# Patient Record
Sex: Male | Born: 1990 | Race: White | Hispanic: No | Marital: Single | Smoking: Never smoker
Health system: Southern US, Community
[De-identification: ages and names within clinical notes are randomized; demographics above are authoritative.]

## PROBLEM LIST (undated history)

## (undated) DIAGNOSIS — K219 Gastro-esophageal reflux disease without esophagitis: Secondary | ICD-10-CM

## (undated) DIAGNOSIS — R251 Tremor, unspecified: Secondary | ICD-10-CM

## (undated) DIAGNOSIS — M51369 Other intervertebral disc degeneration, lumbar region without mention of lumbar back pain or lower extremity pain: Secondary | ICD-10-CM

## (undated) DIAGNOSIS — M5136 Other intervertebral disc degeneration, lumbar region: Secondary | ICD-10-CM

## (undated) DIAGNOSIS — M532X9 Spinal instabilities, site unspecified: Secondary | ICD-10-CM

---

## 2013-08-13 ENCOUNTER — Emergency Department (HOSPITAL_COMMUNITY)
Admission: EM | Admit: 2013-08-13 | Discharge: 2013-08-13 | Disposition: A | Discharge status: Home / Self Care | Payer: PRIVATE HEALTH INSURANCE | Attending: Emergency Medicine | Admitting: Emergency Medicine

## 2013-08-13 ENCOUNTER — Encounter (HOSPITAL_COMMUNITY): Payer: Self-pay | Admitting: Emergency Medicine

## 2013-08-13 DIAGNOSIS — S61412A Laceration without foreign body of left hand, initial encounter: Secondary | ICD-10-CM

## 2013-08-13 DIAGNOSIS — W260XXA Contact with knife, initial encounter: Secondary | ICD-10-CM | POA: Insufficient documentation

## 2013-08-13 DIAGNOSIS — Y929 Unspecified place or not applicable: Secondary | ICD-10-CM | POA: Insufficient documentation

## 2013-08-13 DIAGNOSIS — Z23 Encounter for immunization: Secondary | ICD-10-CM | POA: Insufficient documentation

## 2013-08-13 DIAGNOSIS — Y9389 Activity, other specified: Secondary | ICD-10-CM | POA: Insufficient documentation

## 2013-08-13 DIAGNOSIS — S61409A Unspecified open wound of unspecified hand, initial encounter: Secondary | ICD-10-CM | POA: Insufficient documentation

## 2013-08-13 MED ORDER — TETANUS-DIPHTH-ACELL PERTUSSIS 5-2.5-18.5 LF-MCG/0.5 IM SUSP
0.5000 mL | Freq: Once | INTRAMUSCULAR | Status: AC
  Administered 2013-08-13: 0.5 mL via INTRAMUSCULAR
  Filled 2013-08-13: qty 0.5

## 2013-08-13 NOTE — ED Notes (Signed)
PA at bedside suturing pt laceration.

## 2013-08-13 NOTE — ED Provider Notes (Signed)
  Medical screening examination/treatment/procedure(s) were performed by non-physician practitioner and as supervising physician I was immediately available for consultation/collaboration.  EKG Interpretation   None         Emmali Karow, MD 08/13/13 2357 

## 2013-08-13 NOTE — ED Provider Notes (Signed)
CSN: [id number]     Arrival date & time 08/13/13  1532 History  This chart was scribed for non-physician practitioner, Oletha Blend, working with Gerhard Munch, MD by Shari Heritage, ED Scribe. This patient was seen in room TR08C/TR08C and the patient's care was started at 3:42 PM.    Chief Complaint  Patient presents with  . Laceration    The history is provided by the patient. No language interpreter was used.    HPI Comments: Alexander Knight is a 22 y.o. male who presents to the Emergency Department complaining of a laceration in the web space between the left thumb and left index finger that occurred 1-2 hours ago. He states that he was using a knife to cut a lock when his hand slipped and he cut himself. Patient is now reporting moderate pain to the area surrounding the laceration that is worse with movement of his fingers. He reports normal range of motion of all fingers on his left hand. Bleeding was controlled prior to arrival. He denies numbness or tingling in his left hand. He denies any other symptoms or injuries at this time. He has no chronic medical conditions. He does not smoke.  Unknown last tetanus.   History reviewed. No pertinent past medical history. History reviewed. No pertinent past surgical history. No family history on file. History  Substance Use Topics  . Smoking status: Never Smoker   . Smokeless tobacco: Current User    Types: Chew  . Alcohol Use: No    Review of Systems  Skin: Positive for wound.  Neurological: Negative for numbness.  All other systems reviewed and are negative.    Allergies  Codeine  Home Medications  No current outpatient prescriptions on file. Triage Vitals: BP 158/83  Pulse 113  Temp(Src) 97.5 F (36.4 C) (Oral)  Resp 18  SpO2 98%  Physical Exam  Nursing note and vitals reviewed. Constitutional: He is oriented to person, place, and time. He appears well-developed and well-nourished.  HENT:  Head: Normocephalic  and atraumatic.  Mouth/Throat: Oropharynx is clear and moist.  Eyes: Conjunctivae and EOM are normal. Pupils are equal, round, and reactive to light.  Neck: Normal range of motion.  Cardiovascular: Normal rate, regular rhythm and normal heart sounds.   Pulmonary/Chest: Effort normal and breath sounds normal.  Musculoskeletal: Normal range of motion.       Left hand: He exhibits laceration. He exhibits normal range of motion, normal capillary refill, no deformity and no swelling.       Hands: 2 cm laceration to left thumb and index finger webspace, extensor pollicis brevis visible but appears intact, full range of motion of thumb and index finger, no foreign body or signs of infection, strong radial pulse and cap refill, sensation intact  Neurological: He is alert and oriented to person, place, and time.  Skin: Skin is warm and dry.  Psychiatric: He has a normal mood and affect.    ED Course  Procedures (including critical care time)  LACERATION REPAIR Performed by: Garlon Hatchet Authorized by: Garlon Hatchet Consent: Verbal consent obtained. Risks and benefits: risks, benefits and alternatives were discussed Consent given by: patient Patient identity confirmed: provided demographic data Prepped and Draped in normal sterile fashion Wound explored  Laceration Location: left thumb and index finger webspace  Laceration Length: 2cm  No Foreign Bodies seen or palpated  Anesthesia: local infiltration  Local anesthetic: lidocaine 1% without epinephrine  Anesthetic total: 4 ml  Irrigation method: syringe  Amount of cleaning: standard  Skin closure: 6-0 prolene  Number of sutures: 3  Technique: simple interrrupted  Patient tolerance: Patient tolerated the procedure well with no immediate complications.  DIAGNOSTIC STUDIES: Oxygen Saturation is 98% on room air, normal by my interpretation.    COORDINATION OF CARE: 3:54 PM- Patient informed of current plan for treatment  and evaluation and agrees with plan at this time.     MDM   1. Hand laceration, left, initial encounter    Extensor pollicis brevis exposed and appears intact.  Pt had full ROM of thumb and index finger before and after repair.  Tetanus updated.  Instructed to followup with hand surgery, Dr. Merlyn Lot, if problems occur. Keep area clean and dry. Followup with [facility] urgent [facility] in 7 days for suture removal.  Discussed plan with pt, they agreed.  Return precautions advised.  I personally performed the services described in this documentation, which was scribed in my presence. The recorded information has been reviewed and is accurate.  Garlon Hatchet, PA-C 08/13/13 [phone]

## 2013-08-13 NOTE — ED Notes (Signed)
Pt states that he was working with her knife and cut the skin between his thumb and his pointer finger. Pt has an approx. 2cm lac deep to skin, bleeding controlled. Pt washed hands to clean around the laceration and bacitracin applied until sutures.

## 2013-08-13 NOTE — ED Notes (Signed)
The pt was cuting with a knife and his knife slipped.  Lac to the lt thunb and index finger web space.  No active bleeding

## 2017-03-31 HISTORY — PX: BACK SURGERY: SHX140

## 2017-12-23 ENCOUNTER — Other Ambulatory Visit: Payer: Self-pay | Admitting: Orthopedic Surgery

## 2017-12-23 DIAGNOSIS — M545 Low back pain: Principal | ICD-10-CM

## 2017-12-23 DIAGNOSIS — G8929 Other chronic pain: Secondary | ICD-10-CM

## 2018-01-06 ENCOUNTER — Other Ambulatory Visit: Payer: PRIVATE HEALTH INSURANCE

## 2018-01-15 ENCOUNTER — Ambulatory Visit
Admission: RE | Admit: 2018-01-15 | Discharge: 2018-01-15 | Disposition: A | Discharge status: Home / Self Care | Payer: Worker's Compensation | Source: Ambulatory Visit | Attending: Orthopedic Surgery | Admitting: Orthopedic Surgery

## 2018-01-15 ENCOUNTER — Other Ambulatory Visit: Payer: Self-pay | Admitting: Orthopedic Surgery

## 2018-01-15 ENCOUNTER — Ambulatory Visit
Admission: RE | Admit: 2018-01-15 | Discharge: 2018-01-15 | Disposition: A | Discharge status: Home / Self Care | Payer: Self-pay | Source: Ambulatory Visit | Attending: Orthopedic Surgery | Admitting: Orthopedic Surgery

## 2018-01-15 DIAGNOSIS — M545 Low back pain: Principal | ICD-10-CM

## 2018-01-15 DIAGNOSIS — G8929 Other chronic pain: Secondary | ICD-10-CM

## 2018-01-15 MED ORDER — IOPAMIDOL (ISOVUE-M 200) INJECTION 41%
7.5000 mL | Freq: Once | INTRAMUSCULAR | Status: AC
  Administered 2018-01-15: 7.5 mL

## 2018-01-15 MED ORDER — FENTANYL CITRATE (PF) 100 MCG/2ML IJ SOLN
25.0000 ug | INTRAMUSCULAR | Status: DC | PRN
  Administered 2018-01-15: 25 ug via INTRAVENOUS
  Administered 2018-01-15: 50 ug via INTRAVENOUS

## 2018-01-15 MED ORDER — KETOROLAC TROMETHAMINE 30 MG/ML IJ SOLN
30.0000 mg | Freq: Once | INTRAMUSCULAR | Status: AC
  Administered 2018-01-15: 30 mg via INTRAVENOUS

## 2018-01-15 MED ORDER — MIDAZOLAM HCL 2 MG/2ML IJ SOLN: 1.0000 mg | INTRAMUSCULAR | Status: DC | PRN

## 2018-01-15 MED ORDER — SODIUM CHLORIDE 0.9 % IV SOLN
Freq: Once | INTRAVENOUS | Status: AC
  Administered 2018-01-15: 08:00:00 via INTRAVENOUS

## 2018-01-15 MED ORDER — CEFAZOLIN SODIUM-DEXTROSE 2-4 GM/100ML-% IV SOLN
2.0000 g | INTRAVENOUS | Status: AC
  Administered 2018-01-15: 2 g via INTRAVENOUS

## 2018-01-15 NOTE — Progress Notes (Signed)
Pt tolerated procedure well. Pt currently alert and speaking in complete sentences. Pt states pain is "no bad", only his normal pain, not requesting additional pain medication at this time. Will continue to monitor.

## 2018-01-15 NOTE — Discharge Instructions (Signed)
Discogram Post Procedure Discharge Instructions ° °1. May resume a regular diet and any medications that you routinely take (including pain medications). °2. No driving day of procedure. °3. Upon discharge go home and rest for at least 4 hours.  May use an ice pack as needed to injection sites on back.  Ice to back 30 minutes on and 30 minutes off, all day. °4. May remove bandades later, today. °5. It is not unusual to be sore for several days after this procedure. ° ° ° °Please contact our office at [phone] for the following symptoms: ° °· Fever greater than 100 degrees °· Increased swelling, pain, or redness at injection site. ° ° °Thank you for visiting [facility]. ° ° °

## 2018-04-22 HISTORY — PX: COLONOSCOPY: SHX174

## 2018-06-11 ENCOUNTER — Other Ambulatory Visit: Payer: Self-pay | Admitting: Orthopedic Surgery

## 2018-06-16 ENCOUNTER — Other Ambulatory Visit: Payer: Self-pay

## 2018-06-16 ENCOUNTER — Encounter (HOSPITAL_COMMUNITY): Payer: Self-pay | Admitting: *Deleted

## 2018-06-16 NOTE — Anesthesia Preprocedure Evaluation (Addendum)
Anesthesia Evaluation  Patient identified by MRN, date of birth, ID band Patient awake    Reviewed: Allergy & Precautions, NPO status , Patient's Chart, lab work & pertinent test results  Airway Mallampati: II  TM Distance: >3 FB Neck ROM: Full   Comment: Pt w beard Dental no notable dental hx. (+) Teeth Intact, Dental Advisory Given   Pulmonary neg pulmonary ROS,    Pulmonary exam normal breath sounds clear to auscultation       Cardiovascular Exercise Tolerance: Good Normal cardiovascular exam Rhythm:Regular Rate:Normal     Neuro/Psych negative neurological ROS  negative psych ROS   GI/Hepatic GERD  Medicated and Controlled,  Endo/Other    Renal/GU      Musculoskeletal   Abdominal (+) + obese,   Peds  Hematology   Anesthesia Other Findings   Reproductive/Obstetrics                           Anesthesia Physical Anesthesia Plan  ASA: I  Anesthesia Plan: General   Post-op Pain Management:    Induction: Intravenous  PONV Risk Score and Plan: 3 and Treatment may vary due to age or medical condition, Ondansetron and Dexamethasone  Airway Management Planned: Oral ETT  Additional Equipment:   Intra-op Plan:   Post-operative Plan: Extubation in OR  Informed Consent: I have reviewed the patients History and Physical, chart, labs and discussed the procedure including the risks, benefits and alternatives for the proposed anesthesia with the patient or authorized representative who has indicated his/her understanding and acceptance.   Dental advisory given  Plan Discussed with: CRNA, Anesthesiologist and Surgeon  Anesthesia Plan Comments:        Anesthesia Quick Evaluation

## 2018-06-16 NOTE — Progress Notes (Signed)
I left a voice mail  message for  Alexander Knight to correct the unapproved abbreviation- S1 on the consent.

## 2018-06-17 ENCOUNTER — Inpatient Hospital Stay (HOSPITAL_COMMUNITY)
Admission: RE | Admit: 2018-06-17 | Discharge: 2018-06-18 | DRG: 460 | Disposition: A | Discharge status: Home / Self Care | Payer: PRIVATE HEALTH INSURANCE | Source: Ambulatory Visit | Attending: Orthopedic Surgery | Admitting: Orthopedic Surgery

## 2018-06-17 ENCOUNTER — Inpatient Hospital Stay (HOSPITAL_COMMUNITY): Payer: PRIVATE HEALTH INSURANCE

## 2018-06-17 ENCOUNTER — Inpatient Hospital Stay (HOSPITAL_COMMUNITY): Payer: PRIVATE HEALTH INSURANCE | Admitting: Anesthesiology

## 2018-06-17 ENCOUNTER — Encounter (HOSPITAL_COMMUNITY): Payer: Self-pay | Admitting: Certified Registered Nurse Anesthetist

## 2018-06-17 ENCOUNTER — Encounter (HOSPITAL_COMMUNITY)
Admission: RE | Disposition: A | Discharge status: Home / Self Care | Payer: Self-pay | Source: Ambulatory Visit | Attending: Orthopedic Surgery

## 2018-06-17 DIAGNOSIS — Z885 Allergy status to narcotic agent status: Secondary | ICD-10-CM

## 2018-06-17 DIAGNOSIS — Z720 Tobacco use: Secondary | ICD-10-CM | POA: Diagnosis not present

## 2018-06-17 DIAGNOSIS — M5117 Intervertebral disc disorders with radiculopathy, lumbosacral region: Secondary | ICD-10-CM | POA: Diagnosis present

## 2018-06-17 DIAGNOSIS — K219 Gastro-esophageal reflux disease without esophagitis: Secondary | ICD-10-CM | POA: Diagnosis present

## 2018-06-17 DIAGNOSIS — M5137 Other intervertebral disc degeneration, lumbosacral region: Secondary | ICD-10-CM | POA: Diagnosis present

## 2018-06-17 DIAGNOSIS — Z419 Encounter for procedure for purposes other than remedying health state, unspecified: Secondary | ICD-10-CM

## 2018-06-17 HISTORY — DX: Other intervertebral disc degeneration, lumbar region: M51.36

## 2018-06-17 HISTORY — DX: Other intervertebral disc degeneration, lumbar region without mention of lumbar back pain or lower extremity pain: M51.369

## 2018-06-17 HISTORY — DX: Spinal instabilities, site unspecified: M53.2X9

## 2018-06-17 HISTORY — DX: Tremor, unspecified: R25.1

## 2018-06-17 HISTORY — PX: TRANSFORAMINAL LUMBAR INTERBODY FUSION (TLIF) WITH PEDICLE SCREW FIXATION 2 LEVEL: SHX6142

## 2018-06-17 HISTORY — DX: Gastro-esophageal reflux disease without esophagitis: K21.9

## 2018-06-17 LAB — URINALYSIS, ROUTINE W REFLEX MICROSCOPIC
Bacteria, UA: NONE SEEN
Bilirubin Urine: NEGATIVE
GLUCOSE, UA: NEGATIVE mg/dL
Ketones, ur: NEGATIVE mg/dL
Leukocytes, UA: NEGATIVE
NITRITE: NEGATIVE
PH: 5 (ref 5.0–8.0)
Protein, ur: NEGATIVE mg/dL
SPECIFIC GRAVITY, URINE: 1.028 (ref 1.005–1.030)

## 2018-06-17 LAB — COMPREHENSIVE METABOLIC PANEL
ALBUMIN: 4.3 g/dL (ref 3.5–5.0)
ALK PHOS: 79 U/L (ref 38–126)
ALT: 40 U/L (ref 0–44)
ANION GAP: 12 (ref 5–15)
AST: 26 U/L (ref 15–41)
BILIRUBIN TOTAL: 1.6 mg/dL — AB (ref 0.3–1.2)
BUN: 11 mg/dL (ref 6–20)
CALCIUM: 9.2 mg/dL (ref 8.9–10.3)
CO2: 22 mmol/L (ref 22–32)
Chloride: 105 mmol/L (ref 98–111)
Creatinine, Ser: 0.75 mg/dL (ref 0.61–1.24)
GFR calc non Af Amer: >60 mL/min (ref >60)
GLUCOSE: 108 mg/dL — AB (ref 70–99)
POTASSIUM: 3.7 mmol/L (ref 3.5–5.1)
SODIUM: 139 mmol/L (ref 135–145)
TOTAL PROTEIN: 7.6 g/dL (ref 6.5–8.1)

## 2018-06-17 LAB — PROTIME-INR
INR: 1
Prothrombin Time: 13.1 seconds (ref 11.4–15.2)

## 2018-06-17 LAB — CBC WITH DIFFERENTIAL/PLATELET
Abs Immature Granulocytes: 0.2 10*3/uL — ABNORMAL HIGH (ref 0.0–0.1)
BASOS ABS: 0.1 10*3/uL (ref 0.0–0.1)
Basophils Relative: 1 %
EOS ABS: 0.1 10*3/uL (ref 0.0–0.7)
EOS PCT: 1 %
HEMATOCRIT: 48.3 % (ref 39.0–52.0)
HEMOGLOBIN: 15.9 g/dL (ref 13.0–17.0)
Immature Granulocytes: 2 %
LYMPHS PCT: 27 %
Lymphs Abs: 3.8 10*3/uL (ref 0.7–4.0)
MCH: 29.1 pg (ref 26.0–34.0)
MCHC: 32.9 g/dL (ref 30.0–36.0)
MCV: 88.3 fL (ref 78.0–100.0)
MONO ABS: 1.2 10*3/uL — AB (ref 0.1–1.0)
Monocytes Relative: 9 %
Neutro Abs: 8.6 10*3/uL — ABNORMAL HIGH (ref 1.7–7.7)
Neutrophils Relative %: 60 %
Platelets: 370 10*3/uL (ref 150–400)
RBC: 5.47 MIL/uL (ref 4.22–5.81)
RDW: 11.8 % (ref 11.5–15.5)
WBC: 14 10*3/uL — AB (ref 4.0–10.5)

## 2018-06-17 LAB — APTT: APTT: 30 s (ref 24–36)

## 2018-06-17 SURGERY — TRANSFORAMINAL LUMBAR INTERBODY FUSION (TLIF) WITH PEDICLE SCREW FIXATION 2 LEVEL
Anesthesia: General | Site: Back | Laterality: Right

## 2018-06-17 MED ORDER — DIAZEPAM 5 MG PO TABS
5.0000 mg | ORAL_TABLET | Freq: Four times a day (QID) | ORAL | Status: DC | PRN
  Administered 2018-06-17 – 2018-06-18 (×3): 5 mg via ORAL
  Filled 2018-06-17 (×3): qty 1

## 2018-06-17 MED ORDER — METHYLENE BLUE 0.5 % INJ SOLN
INTRAVENOUS | Status: AC
  Filled 2018-06-17: qty 10

## 2018-06-17 MED ORDER — METHYLENE BLUE 0.5 % INJ SOLN
INTRAVENOUS | Status: DC | PRN
  Administered 2018-06-17: .5 mL via INTRADERMAL

## 2018-06-17 MED ORDER — ACETAMINOPHEN 325 MG PO TABS: 650.0000 mg | ORAL_TABLET | ORAL | Status: DC | PRN

## 2018-06-17 MED ORDER — CLONIDINE HCL 0.2 MG PO TABS
ORAL_TABLET | ORAL | Status: AC
  Administered 2018-06-17: 0.2 mg via ORAL
  Filled 2018-06-17: qty 1

## 2018-06-17 MED ORDER — MIDAZOLAM HCL 5 MG/5ML IJ SOLN
INTRAMUSCULAR | Status: DC | PRN
  Administered 2018-06-17: 2 mg via INTRAVENOUS

## 2018-06-17 MED ORDER — ALUM & MAG HYDROXIDE-SIMETH 200-200-20 MG/5ML PO SUSP: 30.0000 mL | Freq: Four times a day (QID) | ORAL | Status: DC | PRN

## 2018-06-17 MED ORDER — ONDANSETRON HCL 4 MG PO TABS: 4.0000 mg | ORAL_TABLET | Freq: Four times a day (QID) | ORAL | Status: DC | PRN

## 2018-06-17 MED ORDER — ZOLPIDEM TARTRATE 5 MG PO TABS: 5.0000 mg | ORAL_TABLET | Freq: Every evening | ORAL | Status: DC | PRN

## 2018-06-17 MED ORDER — HYDROCODONE-ACETAMINOPHEN 7.5-325 MG PO TABS
ORAL_TABLET | ORAL | Status: AC
  Filled 2018-06-17: qty 1

## 2018-06-17 MED ORDER — ONDANSETRON HCL 4 MG/2ML IJ SOLN
INTRAMUSCULAR | Status: AC
  Filled 2018-06-17: qty 2

## 2018-06-17 MED ORDER — ONDANSETRON HCL 4 MG/2ML IJ SOLN
INTRAMUSCULAR | Status: DC | PRN
  Administered 2018-06-17: 4 mg via INTRAVENOUS

## 2018-06-17 MED ORDER — SODIUM CHLORIDE 0.9 % IV SOLN
INTRAVENOUS | Status: DC | PRN
  Administered 2018-06-17: 14:00:00 via INTRAVENOUS
  Administered 2018-06-17: 20 ug/min via INTRAVENOUS

## 2018-06-17 MED ORDER — SODIUM CHLORIDE 0.9 % IV SOLN: 250.0000 mL | INTRAVENOUS | Status: DC

## 2018-06-17 MED ORDER — THROMBIN (RECOMBINANT) 20000 UNITS EX SOLR
CUTANEOUS | Status: AC
  Filled 2018-06-17: qty 20000

## 2018-06-17 MED ORDER — PHENYLEPHRINE 40 MCG/ML (10ML) SYRINGE FOR IV PUSH (FOR BLOOD PRESSURE SUPPORT)
PREFILLED_SYRINGE | INTRAVENOUS | Status: AC
  Filled 2018-06-17: qty 20

## 2018-06-17 MED ORDER — SODIUM CHLORIDE 0.9% FLUSH: 3.0000 mL | INTRAVENOUS | Status: DC | PRN

## 2018-06-17 MED ORDER — OXYCODONE-ACETAMINOPHEN 5-325 MG PO TABS
1.0000 | ORAL_TABLET | ORAL | Status: DC | PRN
  Administered 2018-06-17 – 2018-06-18 (×5): 2 via ORAL
  Filled 2018-06-17 (×5): qty 2

## 2018-06-17 MED ORDER — PROPOFOL 10 MG/ML IV BOLUS
INTRAVENOUS | Status: DC | PRN
  Administered 2018-06-17: 200 mg via INTRAVENOUS

## 2018-06-17 MED ORDER — MEPERIDINE HCL 50 MG/ML IJ SOLN: 6.2500 mg | INTRAMUSCULAR | Status: DC | PRN

## 2018-06-17 MED ORDER — HYDROCODONE-ACETAMINOPHEN 7.5-325 MG PO TABS
1.0000 | ORAL_TABLET | Freq: Once | ORAL | Status: AC | PRN
  Administered 2018-06-17: 1 via ORAL

## 2018-06-17 MED ORDER — FENTANYL CITRATE (PF) 250 MCG/5ML IJ SOLN
INTRAMUSCULAR | Status: AC
  Filled 2018-06-17: qty 5

## 2018-06-17 MED ORDER — BUPIVACAINE-EPINEPHRINE 0.25% -1:200000 IJ SOLN
INTRAMUSCULAR | Status: DC | PRN
  Administered 2018-06-17: 30 mL

## 2018-06-17 MED ORDER — PHENOL 1.4 % MT LIQD: 1.0000 | OROMUCOSAL | Status: DC | PRN

## 2018-06-17 MED ORDER — ONDANSETRON HCL 4 MG/2ML IJ SOLN: 4.0000 mg | Freq: Four times a day (QID) | INTRAMUSCULAR | Status: DC | PRN

## 2018-06-17 MED ORDER — PROMETHAZINE HCL 25 MG/ML IJ SOLN: 6.2500 mg | INTRAMUSCULAR | Status: DC | PRN

## 2018-06-17 MED ORDER — BUPIVACAINE-EPINEPHRINE (PF) 0.25% -1:200000 IJ SOLN
INTRAMUSCULAR | Status: AC
  Filled 2018-06-17: qty 30

## 2018-06-17 MED ORDER — THROMBIN 20000 UNITS EX SOLR
CUTANEOUS | Status: DC | PRN
  Administered 2018-06-17: 20000 [IU] via TOPICAL

## 2018-06-17 MED ORDER — FENTANYL CITRATE (PF) 100 MCG/2ML IJ SOLN
INTRAMUSCULAR | Status: DC | PRN
  Administered 2018-06-17: 50 ug via INTRAVENOUS
  Administered 2018-06-17: 200 ug via INTRAVENOUS
  Administered 2018-06-17 (×3): 50 ug via INTRAVENOUS

## 2018-06-17 MED ORDER — OXYCODONE HCL 5 MG PO TABS: 5.0000 mg | ORAL_TABLET | ORAL | Status: DC | PRN

## 2018-06-17 MED ORDER — ALBUMIN HUMAN 5 % IV SOLN
INTRAVENOUS | Status: DC | PRN
  Administered 2018-06-17: 10:00:00 via INTRAVENOUS

## 2018-06-17 MED ORDER — SUCCINYLCHOLINE CHLORIDE 200 MG/10ML IV SOSY
PREFILLED_SYRINGE | INTRAVENOUS | Status: AC
  Filled 2018-06-17: qty 10

## 2018-06-17 MED ORDER — SODIUM CHLORIDE 0.9 % IV SOLN
250.0000 mL | INTRAVENOUS | Status: DC
  Administered 2018-06-17: 250 mL via INTRAVENOUS

## 2018-06-17 MED ORDER — PHENYLEPHRINE 40 MCG/ML (10ML) SYRINGE FOR IV PUSH (FOR BLOOD PRESSURE SUPPORT)
PREFILLED_SYRINGE | INTRAVENOUS | Status: DC | PRN
  Administered 2018-06-17: 160 ug via INTRAVENOUS

## 2018-06-17 MED ORDER — GABAPENTIN 300 MG PO CAPS
300.0000 mg | ORAL_CAPSULE | Freq: Once | ORAL | Status: AC
  Administered 2018-06-17: 300 mg via ORAL

## 2018-06-17 MED ORDER — ACETAMINOPHEN 500 MG PO TABS
1000.0000 mg | ORAL_TABLET | Freq: Once | ORAL | Status: AC
  Administered 2018-06-17: 1000 mg via ORAL

## 2018-06-17 MED ORDER — MIDAZOLAM HCL 2 MG/2ML IJ SOLN
INTRAMUSCULAR | Status: AC
  Filled 2018-06-17: qty 2

## 2018-06-17 MED ORDER — POVIDONE-IODINE 7.5 % EX SOLN
1.0000 "application " | Freq: Once | CUTANEOUS | Status: DC
  Filled 2018-06-17: qty 118

## 2018-06-17 MED ORDER — BUPIVACAINE LIPOSOME 1.3 % IJ SUSP
20.0000 mL | INTRAMUSCULAR | Status: AC
  Administered 2018-06-17: 20 mL
  Filled 2018-06-17: qty 20

## 2018-06-17 MED ORDER — MORPHINE SULFATE (PF) 2 MG/ML IV SOLN
1.0000 mg | INTRAVENOUS | Status: DC | PRN
  Administered 2018-06-17: 1 mg via INTRAVENOUS
  Filled 2018-06-17: qty 1

## 2018-06-17 MED ORDER — MENTHOL 3 MG MT LOZG: 1.0000 | LOZENGE | OROMUCOSAL | Status: DC | PRN

## 2018-06-17 MED ORDER — HYDROMORPHONE HCL 1 MG/ML IJ SOLN
INTRAMUSCULAR | Status: AC
  Administered 2018-06-17: 0.5 mg via INTRAVENOUS
  Filled 2018-06-17: qty 1

## 2018-06-17 MED ORDER — HYDROMORPHONE HCL 1 MG/ML IJ SOLN
0.2500 mg | INTRAMUSCULAR | Status: DC | PRN
  Administered 2018-06-17 (×2): 0.5 mg via INTRAVENOUS

## 2018-06-17 MED ORDER — 0.9 % SODIUM CHLORIDE (POUR BTL) OPTIME
TOPICAL | Status: DC | PRN
  Administered 2018-06-17 (×3): 1000 mL

## 2018-06-17 MED ORDER — GABAPENTIN 300 MG PO CAPS
ORAL_CAPSULE | ORAL | Status: AC
  Administered 2018-06-17: 300 mg via ORAL
  Filled 2018-06-17: qty 1

## 2018-06-17 MED ORDER — EPHEDRINE 5 MG/ML INJ
INTRAVENOUS | Status: AC
  Filled 2018-06-17: qty 10

## 2018-06-17 MED ORDER — LACTATED RINGERS IV SOLN
INTRAVENOUS | Status: DC | PRN
  Administered 2018-06-17 (×3): via INTRAVENOUS

## 2018-06-17 MED ORDER — CLONIDINE HCL 0.2 MG PO TABS
0.2000 mg | ORAL_TABLET | Freq: Once | ORAL | Status: AC
  Administered 2018-06-17: 0.2 mg via ORAL

## 2018-06-17 MED ORDER — SODIUM CHLORIDE 0.9% FLUSH
3.0000 mL | Freq: Two times a day (BID) | INTRAVENOUS | Status: DC
  Administered 2018-06-17: 3 mL via INTRAVENOUS

## 2018-06-17 MED ORDER — CEFAZOLIN SODIUM-DEXTROSE 1-4 GM/50ML-% IV SOLN
1.0000 g | Freq: Three times a day (TID) | INTRAVENOUS | Status: AC
  Administered 2018-06-17 – 2018-06-18 (×2): 1 g via INTRAVENOUS
  Filled 2018-06-17 (×2): qty 50

## 2018-06-17 MED ORDER — ACETAMINOPHEN 10 MG/ML IV SOLN: 1000.0000 mg | Freq: Once | INTRAVENOUS | Status: DC | PRN

## 2018-06-17 MED ORDER — ACETAMINOPHEN 500 MG PO TABS
ORAL_TABLET | ORAL | Status: AC
  Administered 2018-06-17: 1000 mg via ORAL
  Filled 2018-06-17: qty 2

## 2018-06-17 MED ORDER — LIDOCAINE HCL (CARDIAC) PF 100 MG/5ML IV SOSY
PREFILLED_SYRINGE | INTRAVENOUS | Status: DC | PRN
  Administered 2018-06-17: 100 mg via INTRAVENOUS

## 2018-06-17 MED ORDER — PROPOFOL 10 MG/ML IV BOLUS
INTRAVENOUS | Status: AC
  Filled 2018-06-17: qty 20

## 2018-06-17 MED ORDER — POTASSIUM CHLORIDE IN NACL 20-0.9 MEQ/L-% IV SOLN: INTRAVENOUS | Status: DC

## 2018-06-17 MED ORDER — ROCURONIUM BROMIDE 10 MG/ML (PF) SYRINGE
PREFILLED_SYRINGE | INTRAVENOUS | Status: DC | PRN
  Administered 2018-06-17: 10 mg via INTRAVENOUS
  Administered 2018-06-17: 20 mg via INTRAVENOUS
  Administered 2018-06-17: 50 mg via INTRAVENOUS
  Administered 2018-06-17: 20 mg via INTRAVENOUS

## 2018-06-17 MED ORDER — DEXAMETHASONE SODIUM PHOSPHATE 10 MG/ML IJ SOLN
INTRAMUSCULAR | Status: DC | PRN
  Administered 2018-06-17: 10 mg via INTRAVENOUS

## 2018-06-17 MED ORDER — CEFAZOLIN SODIUM-DEXTROSE 2-4 GM/100ML-% IV SOLN
2.0000 g | INTRAVENOUS | Status: AC
  Administered 2018-06-17 (×2): 2 g via INTRAVENOUS

## 2018-06-17 MED ORDER — CEFAZOLIN SODIUM-DEXTROSE 2-4 GM/100ML-% IV SOLN
INTRAVENOUS | Status: AC
  Filled 2018-06-17: qty 100

## 2018-06-17 MED ORDER — ACETAMINOPHEN 650 MG RE SUPP: 650.0000 mg | RECTAL | Status: DC | PRN

## 2018-06-17 SURGICAL SUPPLY — 92 items
BENZOIN TINCTURE PRP APPL 2/3 (GAUZE/BANDAGES/DRESSINGS) ×3 IMPLANT
BLADE CLIPPER SURG (BLADE) IMPLANT
BONE VIVIGEN FORMABLE 5.4CC (Bone Implant) ×6 IMPLANT
BUR PRESCISION 1.7 ELITE (BURR) ×3 IMPLANT
BUR ROUND FLUTED 5 RND (BURR) ×2 IMPLANT
BUR ROUND FLUTED 5MM RND (BURR) ×1
BUR ROUND PRECISION 4.0 (BURR) IMPLANT
BUR ROUND PRECISION 4.0MM (BURR)
BUR SABER RD CUTTING 3.0 (BURR) IMPLANT
BUR SABER RD CUTTING 3.0MM (BURR)
CAGE BULLET CONCORDE 9X10X27 (Cage) ×2 IMPLANT
CAGE BULLET CONCORDE 9X10X27MM (Cage) ×1 IMPLANT
CAGE CONCORDE BULLET 9X11X27 (Cage) ×1 IMPLANT
CAGE CONCORDE BULLET 9X11X27MM (Cage) ×1 IMPLANT
CAGE SPNL 5D BLT NOSE 27X9X11 (Cage) ×1 IMPLANT
CARTRIDGE OIL MAESTRO DRILL (MISCELLANEOUS) ×1 IMPLANT
CLOSURE WOUND 1/2 X4 (GAUZE/BANDAGES/DRESSINGS) ×2
CONT SPEC 4OZ CLIKSEAL STRL BL (MISCELLANEOUS) ×3 IMPLANT
COVER MAYO STAND STRL (DRAPES) ×6 IMPLANT
COVER SURGICAL LIGHT HANDLE (MISCELLANEOUS) ×3 IMPLANT
DIFFUSER DRILL AIR PNEUMATIC (MISCELLANEOUS) ×3 IMPLANT
DRAIN CHANNEL 15F RND FF W/TCR (WOUND CARE) IMPLANT
DRAPE C-ARM 42X72 X-RAY (DRAPES) ×3 IMPLANT
DRAPE C-ARMOR (DRAPES) ×3 IMPLANT
DRAPE POUCH INSTRU U-SHP 10X18 (DRAPES) ×3 IMPLANT
DRAPE SURG 17X23 STRL (DRAPES) ×12 IMPLANT
DURAPREP 26ML APPLICATOR (WOUND CARE) ×3 IMPLANT
ELECT BLADE 4.0 EZ CLEAN MEGAD (MISCELLANEOUS) ×3
ELECT CAUTERY BLADE 6.4 (BLADE) ×3 IMPLANT
ELECT REM PT RETURN 9FT ADLT (ELECTROSURGICAL) ×3
ELECTRODE BLDE 4.0 EZ CLN MEGD (MISCELLANEOUS) ×1 IMPLANT
ELECTRODE REM PT RTRN 9FT ADLT (ELECTROSURGICAL) ×1 IMPLANT
EVACUATOR SILICONE 100CC (DRAIN) IMPLANT
FEE INTRAOP MONITOR IMPULS NCS (MISCELLANEOUS) ×1 IMPLANT
FILTER STRAW FLUID ASPIR (MISCELLANEOUS) ×3 IMPLANT
GAUZE 4X4 16PLY RFD (DISPOSABLE) ×3 IMPLANT
GAUZE SPONGE 4X4 12PLY STRL (GAUZE/BANDAGES/DRESSINGS) ×3 IMPLANT
GLOVE BIO SURGEON STRL SZ7 (GLOVE) ×3 IMPLANT
GLOVE BIO SURGEON STRL SZ8 (GLOVE) ×3 IMPLANT
GLOVE BIOGEL PI IND STRL 7.0 (GLOVE) ×1 IMPLANT
GLOVE BIOGEL PI IND STRL 8 (GLOVE) ×1 IMPLANT
GLOVE BIOGEL PI INDICATOR 7.0 (GLOVE) ×2
GLOVE BIOGEL PI INDICATOR 8 (GLOVE) ×2
GOWN STRL REUS W/ TWL LRG LVL3 (GOWN DISPOSABLE) ×2 IMPLANT
GOWN STRL REUS W/ TWL XL LVL3 (GOWN DISPOSABLE) ×1 IMPLANT
GOWN STRL REUS W/TWL LRG LVL3 (GOWN DISPOSABLE) ×4
GOWN STRL REUS W/TWL XL LVL3 (GOWN DISPOSABLE) ×2
INTRAOP MONITOR FEE IMPULS NCS (MISCELLANEOUS) ×1
INTRAOP MONITOR FEE IMPULSE (MISCELLANEOUS) ×2
IV CATH 14GX2 1/4 (CATHETERS) ×3 IMPLANT
KIT BASIN OR (CUSTOM PROCEDURE TRAY) ×3 IMPLANT
KIT POSITION SURG JACKSON T1 (MISCELLANEOUS) ×3 IMPLANT
KIT TURNOVER KIT B (KITS) ×3 IMPLANT
MARKER SKIN DUAL TIP RULER LAB (MISCELLANEOUS) ×6 IMPLANT
NDL SAFETY ECLIPSE 18X1.5 (NEEDLE) ×1 IMPLANT
NEEDLE 22X1 1/2 (OR ONLY) (NEEDLE) ×6 IMPLANT
NEEDLE HYPO 18GX1.5 SHARP (NEEDLE) ×2
NEEDLE HYPO 25GX1X1/2 BEV (NEEDLE) ×3 IMPLANT
NEEDLE SPNL 18GX3.5 QUINCKE PK (NEEDLE) ×6 IMPLANT
NS IRRIG 1000ML POUR BTL (IV SOLUTION) ×3 IMPLANT
OIL CARTRIDGE MAESTRO DRILL (MISCELLANEOUS) ×3
PACK LAMINECTOMY ORTHO (CUSTOM PROCEDURE TRAY) ×3 IMPLANT
PACK UNIVERSAL I (CUSTOM PROCEDURE TRAY) ×3 IMPLANT
PAD ARMBOARD 7.5X6 YLW CONV (MISCELLANEOUS) ×6 IMPLANT
PATTIES SURGICAL .5 X1 (DISPOSABLE) ×3 IMPLANT
PATTIES SURGICAL .5X1.5 (GAUZE/BANDAGES/DRESSINGS) ×3 IMPLANT
PROBE PEDCLE PROBE MAGSTM DISP (MISCELLANEOUS) ×3 IMPLANT
PUTTY DBX 1CC (Putty) ×3 IMPLANT
PUTTY DBX 1CC DEPUY (Putty) ×1 IMPLANT
ROD EXPEDIUM PER BENT 65MM (Rod) ×6 IMPLANT
SCREW CORTICAL VIPER 7X40MM (Screw) ×6 IMPLANT
SCREW SET SINGLE INNER (Screw) ×18 IMPLANT
SCREW VIPER CORTICAL FIX 6X40 (Screw) ×12 IMPLANT
SPONGE INTESTINAL PEANUT (DISPOSABLE) ×3 IMPLANT
SPONGE SURGIFOAM ABS GEL 100 (HEMOSTASIS) ×3 IMPLANT
STAPLER VISISTAT 35W (STAPLE) ×3 IMPLANT
STRIP CLOSURE SKIN 1/2X4 (GAUZE/BANDAGES/DRESSINGS) ×4 IMPLANT
SURGIFLO W/THROMBIN 8M KIT (HEMOSTASIS) IMPLANT
SUT MNCRL AB 4-0 PS2 18 (SUTURE) ×3 IMPLANT
SUT VIC AB 0 CT1 18XCR BRD 8 (SUTURE) ×1 IMPLANT
SUT VIC AB 0 CT1 8-18 (SUTURE) ×2
SUT VIC AB 1 CT1 18XCR BRD 8 (SUTURE) ×2 IMPLANT
SUT VIC AB 1 CT1 8-18 (SUTURE) ×4
SUT VIC AB 2-0 CT2 18 VCP726D (SUTURE) ×6 IMPLANT
SYR 20CC LL (SYRINGE) ×6 IMPLANT
SYR BULB IRRIGATION 50ML (SYRINGE) ×3 IMPLANT
SYR CONTROL 10ML LL (SYRINGE) ×6 IMPLANT
SYR TB 1ML LUER SLIP (SYRINGE) ×3 IMPLANT
TAPE CLOTH SURG 4X10 WHT LF (GAUZE/BANDAGES/DRESSINGS) ×3 IMPLANT
TRAY FOLEY MTR SLVR 16FR STAT (SET/KITS/TRAYS/PACK) ×3 IMPLANT
WATER STERILE IRR 1000ML POUR (IV SOLUTION) ×3 IMPLANT
YANKAUER SUCT BULB TIP NO VENT (SUCTIONS) ×3 IMPLANT

## 2018-06-17 NOTE — H&P (Signed)
PREOPERATIVE H&P  Chief Complaint: Low back pain  HPI: Alexander Knight is a 27 y.o. male who presents with ongoing pain in the low back and bilateral legs s/p a work injury dated 09/05/2017.   MRI reveals DDD at L4/5 and L5/S1. + discogram at these levels with a negative control level.   Patient has failed multiple forms of conservative care and continues to have pain (see office notes for additional details regarding the patient's full course of treatment)  Past Medical History:  Diagnosis Date  . DDD (degenerative disc disease), lumbar   . GERD (gastroesophageal reflux disease)    tums prn  . Spine instability   . Tremor    attention tremor   Past Surgical History:  Procedure Laterality Date  . BACK SURGERY Left 03/31/2017   Hemilaminestomy/ DIscectomy L4-L5, L5-S1  . COLONOSCOPY  04/22/2018   Social History   Socioeconomic History  . Marital status: Single    Spouse name: Not on file  . Number of children: Not on file  . Years of education: Not on file  . Highest education level: Not on file  Occupational History  . Not on file  Social Needs  . Financial resource strain: Not on file  . Food insecurity:    Worry: Not on file    Inability: Not on file  . Transportation needs:    Medical: Not on file    Non-medical: Not on file  Tobacco Use  . Smoking status: Never Smoker  . Smokeless tobacco: Current User    Types: Chew  . Tobacco comment:  1 can per day  Substance and Sexual Activity  . Alcohol use: No  . Drug use: Not on file  . Sexual activity: Not on file  Lifestyle  . Physical activity:    Days per week: Not on file    Minutes per session: Not on file  . Stress: Not on file  Relationships  . Social connections:    Talks on phone: Not on file    Gets together: Not on file    Attends religious service: Not on file    Active member of club or organization: Not on file    Attends meetings of clubs or organizations: Not on file    Relationship  status: Not on file  Other Topics Concern  . Not on file  Social History Narrative  . Not on file   Family History  Problem Relation Age of Onset  . Heart disease Maternal Grandmother   . High blood pressure Paternal Grandmother    Allergies  Allergen Reactions  . Codeine Nausea And Vomiting   Prior to Admission medications   Medication Sig Start Date End Date Taking? Authorizing Provider  acetaminophen-codeine (TYLENOL #3) 300-30 MG tablet Take 1-2 tablets by mouth every 6 (six) hours as needed for pain. 05/13/18  Yes [provider]  cyclobenzaprine (FLEXERIL) 5 MG tablet Take 5 mg by mouth at bedtime as needed for muscle spasms. 05/13/18  Yes [provider]     All other systems have been reviewed and were otherwise negative with the exception of those mentioned in the HPI and as above.  Physical Exam: Vitals:   06/17/18 0634  BP: (!) 149/95  Pulse: (!) 109  Resp: 20  Temp: 98.1 F (36.7 C)  SpO2: 97%    Body mass index is 30.96 kg/m.  General: Alert, no acute distress Cardiovascular: No pedal edema Respiratory: No cyanosis, no use of accessory  musculature Skin: No lesions in the area of chief complaint Neurologic: Sensation intact distally Psychiatric: Patient is competent for consent with normal mood and affect Lymphatic: No axillary or cervical lymphadenopathy  Assessment/Plan: AXIAL LOW BACK PAIN / DEGENERATIVE DISC DISEASE AT L4-L5 AND L5-S1 Plan for Procedure(s): RIGHT-SIDED LUMBAR 4-5, LUMBAR 5 - SACRUM 1 TRANSFORAMINAL LUMBAR INTERBODY FUSION WITH INSTRUMENTATION AND ALLOGRAFT (6 hours requested)   Emilee HeroUMONSKI,Amand Lemoine LEONARD, MD 06/17/2018 7:46 AM

## 2018-06-17 NOTE — Transfer of Care (Signed)
**Note Alexander-Identified via Obfuscation** Immediate Anesthesia Transfer of Care Note  Patient: Alexander Knight  Procedure(s) Performed: RIGHT-SIDED LUMBAR 4-5, LUMBAR 5 - SACRUM 1 TRANSFORAMINAL LUMBAR INTERBODY FUSION WITH INSTRUMENTATION AND ALLOGRAFT (6 hours requested) (Right Back)  Patient Location: PACU  Anesthesia Type:General  Level of Consciousness: drowsy  Airway & Oxygen Therapy: Patient Spontanous Breathing and Patient connected to nasal cannula oxygen  Post-op Assessment: Report given to RN and Post -op Vital signs reviewed and stable  Post vital signs: Reviewed and stable  Last Vitals:  Vitals Value Taken Time  BP 103/58 06/17/2018  3:10 PM  Temp    Pulse 114 06/17/2018  3:10 PM  Resp 18 06/17/2018  3:10 PM  SpO2 100 % 06/17/2018  3:10 PM  Vitals shown include unvalidated device data.  Last Pain:  Vitals:   06/17/18 0652  TempSrc:   PainSc: 4       Patients Stated Pain Goal: 2 (06/17/18 [id number])  Complications: No apparent anesthesia complications

## 2018-06-17 NOTE — Anesthesia Procedure Notes (Signed)
Procedure Name: Intubation Date/Time: 06/17/2018 8:35 AM Performed by: Carney Living, CRNA Pre-anesthesia Checklist: Patient identified, Emergency Drugs available, Suction available, Patient being monitored and Timeout performed Patient Re-evaluated:Patient Re-evaluated prior to induction Oxygen Delivery Method: Circle system utilized Preoxygenation: Pre-oxygenation with 100% oxygen Induction Type: IV induction Ventilation: Mask ventilation without difficulty and Oral airway inserted - appropriate to patient size Laryngoscope Size: Mac and 4 Grade View: Grade II Tube type: Oral Tube size: 7.5 mm Number of attempts: 1 Airway Equipment and Method: Stylet Placement Confirmation: ETT inserted through vocal cords under direct vision,  positive ETCO2 and breath sounds checked- equal and bilateral Secured at: 22 cm Tube secured with: Tape Dental Injury: Teeth and Oropharynx as per pre-operative assessment

## 2018-06-17 NOTE — Op Note (Signed)
MEDICAL RECORD NO: [id number]   PHYSICIAN:  Estill Bamberg, MD      DATE OF BIRTH:  1991/03/24   DATE OF PROCEDURE:  06/17/2018                                                            OPERATIVE REPORT     PREOPERATIVE DIAGNOSES: 1. Bilateral lumbar radiculopathy. 2. Degenerative disc disease, L4/5, L5/S1 3. S/p previous L4/5 and L5/S1 decompression   POSTOPERATIVE DIAGNOSES: 1. Bilateral lumbar radiculopathy. 2. Degenerative disc disease, L4/5, L5/S1 3. S/p previous L4/5 and L5/S1 decompression   PROCEDURES: 1. Right-sided L4-5, L5/S1 transforaminal lumbar interbody fusion. 2. Left-sided L4-5 posterolateral fusion. 3. Insertion of interbody device x2 (Concorde intervertebral spacers). 4. Placement of posterior instrumentation L4, L5, S1 bilaterally. 5. Use of local autograft. 6. Use of morselized allograft - Vivigen and DBX-putty 7. Intraoperative use of fluoroscopy.   SURGEON:  Estill Bamberg, MD.   ASSISTANT:  Alexander Florence, PA-C.   ANESTHESIA:  General endotracheal anesthesia.   COMPLICATIONS:  None.   DISPOSITION:  Stable.   ESTIMATED BLOOD LOSS:  250cc   INDICATIONS FOR SURGERY:  Briefly, Alexander Knight is a pleasant 27 year old male who did present to me with severe and ongoing pain in his low back and bilateral legs.    His symptoms have been present for well over a year.  I did feel that the symptoms were secondary to the findings noted above. He did fail extensive conservative care and did wish to proceed with the procedure noted above.   OPERATIVE DETAILS:  On  06/17/2018, the patient was brought to surgery and general endotracheal anesthesia was administered.  The patient was placed prone on a well-padded flat Jackson bed with a spinal frame.  Antibiotics were given and a time-out procedure was performed. The back was prepped and draped in the usual fashion.  A midline incision was made overlying the L4-5 and L5-S1 intervertebral spaces, in line with his previous scar.   The fascia was incised at the midline.  The paraspinal musculature was bluntly swept laterally.  Anatomic landmarks for the pedicles were exposed. Using fluoroscopy, I did cannulate the L4, L5, and S1 pedicles bilaterally, using a medial to lateral cortical trajectory technique at L4 and L5, and using a straight/medial trajectory technique at S1 bilaterally.  On the left side, the posterolateral gutter and left facet joint at L4-5 and L5-S1 was decorticated and 6 mm screws of the appropriate length were placed at L4 and L5, and a 7 mm screw was placed at S1 and a 65-mm rod was placed and distraction was applied across the rod at each intervertebral level.  On the right side, the cannulated pedicle holes were filled with bone wax.  I then proceeded with the decompressive aspect of the procedure.     Starting at L5-S1, I did perform a laminotomy and a full facetectomy on the right.  There was noted to be a chronic appearing right-sided L5-S1 disc protrusion. The protrusion was removed, and the S1 nerves were entirely decompressed. Once this was done, with an assistant holding medial retraction of the traversing right S1 nerve, I did perform a thorough and complete L5-S1 intervertebral discectomy.  The intervertebral space was then liberally packed with autograft from the decompression, as well as allograft in  the form of Vivigen, as was the appropriately sized intervertebral spacer (10 mm, lordotic).  Distraction was then released on the contralateral left side.  I then turned my attention to the L4-5 level.  Once again, it was clearly evident that there was a chronic appearing disc protrusion affecting the lateral recess.  I was however able to develop a plane and thoroughly remove the protrusion. With the assistant holding medial retraction of the traversing right L5 nerve, I did perform an annulotomy at the posterolateral aspect of the L4-5 intervertebral space.  I then used a series of curettes and  pituitary rongeurs to perform a thorough and complete intervertebral diskectomy.  The intervertebral space was then liberally packed with autograft as well as allograft in the form of Vivigen, as was the appropriate-sized intervertebral spacer (11 mm, lordotic).  The spacer was then tamped into position in the usual fashion.  I was very pleased with the press-fit of the spacer.  I then placed 6 mm screws on the right at L4 and L5, and a 7 mm screw at S1 on the right side.  A 65-mm rod was then placed and caps were placed. The distraction was then released on the contralateral left side.  All 6 caps were then locked.  The wound was copiously irrigated with a total of approximately 3 L prior to placing the bone graft.  Additional autograft and allograft (DBX-putty) was then packed into the posterolateral gutter on the left side to help aid in the success of the fusion.  The wound was  explored for any undue bleeding and there was no substantial bleeding encountered.  Gel-Foam was placed over the laminectomy site.  The wound was then closed in layers using #1 Vicryl followed by 2-0 Vicryl, followed by 4-0 Monocryl.  Benzoin and Steri-Strips were applied followed by sterile dressing.   Of note, did use triggered EMG to test the screws on the left, and there is no screw the tested below 20 mA.  There is no sustained abnormal EMG activity noted throughout the surgery.   Of note, Alexander Florencendrew Nida, PA-C was my assistant throughout surgery, and did aid in retraction, suctioning, and closure.       Estill BambergMark Tyleah Loh, MD

## 2018-06-18 ENCOUNTER — Encounter (HOSPITAL_COMMUNITY): Payer: Self-pay | Admitting: Orthopedic Surgery

## 2018-06-18 MED FILL — Thrombin (Recombinant) For Soln 20000 Unit: CUTANEOUS | Qty: 1 | Status: AC

## 2018-06-18 NOTE — Anesthesia Postprocedure Evaluation (Signed)
**Note Alexander-Identified via Obfuscation** Anesthesia Post Note  Patient: Alexander Knight  Procedure(s) Performed: RIGHT-SIDED LUMBAR 4-5, LUMBAR 5 - SACRUM 1 TRANSFORAMINAL LUMBAR INTERBODY FUSION WITH INSTRUMENTATION AND ALLOGRAFT (6 hours requested) (Right Back)     Patient location during evaluation: PACU Anesthesia Type: General Level of consciousness: awake and alert Pain management: pain level controlled Vital Signs Assessment: post-procedure vital signs reviewed and stable Respiratory status: spontaneous breathing, nonlabored ventilation, respiratory function stable and patient connected to nasal cannula oxygen Cardiovascular status: blood pressure returned to baseline and stable Postop Assessment: no apparent nausea or vomiting Anesthetic complications: no    Last Vitals:  Vitals:   06/18/18 0348 06/18/18 0743  BP: 109/61 125/74  Pulse: 85 88  Resp: 20 19  Temp: 36.7 C 36.7 C  SpO2: 100% 99%    Last Pain:  Vitals:   06/18/18 1050  TempSrc:   PainSc: 4                  Trevor IhaStephen A Houser

## 2018-06-18 NOTE — Evaluation (Signed)
Physical Therapy Evaluation Patient Details Name: Alexander Knight MRN: [id number] DOB: 1991-09-23 Today's Date: 06/18/2018   History of Present Illness  Pt is s/p L4-S1 fusion  Clinical Impression  Patient evaluated by Physical Therapy with no further acute PT needs identified. Patient ambulating 400 feet with no assistive device without difficulty. Does not have to negotiate steps at home. Educated patient on spinal precautions with ADL's, generalized walking program, and car transfer technique. All education has been completed and the patient has no further questions. See below for any follow-up Physical Therapy or equipment needs. PT is signing off. Thank you for this referral.     Follow Up Recommendations No PT follow up    Equipment Recommendations  None recommended by PT    Recommendations for Other Services       Precautions / Restrictions Precautions Precautions: Fall;Back Precaution Booklet Issued: Yes (comment) Precaution Comments: verbally reviewed and provided written handout Required Braces or Orthoses: Spinal Brace Spinal Brace: Thoracolumbosacral orthotic;Applied in sitting position Restrictions Weight Bearing Restrictions: No      Mobility  Bed Mobility Overal bed mobility: Modified Independent             General bed mobility comments: instructions for log roll technique  Transfers Overall transfer level: Needs assistance Equipment used: None Transfers: Sit to/from Stand Sit to Stand: Supervision         General transfer comment: increased time to achieve knee extension initially  Ambulation/Gait Ambulation/Gait assistance: Supervision Gait Distance (Feet): 400 Feet Assistive device: None Gait Pattern/deviations: Wide base of support;Step-through pattern;Decreased dorsiflexion - right;Decreased dorsiflexion - left Gait velocity: decr   General Gait Details: Patient with no gross unsteadiness noted  Stairs            Wheelchair  Mobility    Modified Rankin (Stroke Patients Only)       Balance Overall balance assessment: Mild deficits observed, not formally tested                                           Pertinent Vitals/Pain Pain Assessment: Faces Faces Pain Scale: Hurts little more Pain Location: incisional Pain Descriptors / Indicators: Operative site guarding;Grimacing Pain Intervention(s): Monitored during session    Home Living Family/patient expects to be discharged to:: Private residence Living Arrangements: Spouse/significant other Available Help at Discharge: Family Type of Home: House Home Access: Level entry     Home Layout: One level Home Equipment: Cane - single point;Toilet riser;Shower seat      Prior Function Level of Independence: Needs assistance   Gait / Transfers Assistance Needed: using cane for mobility  ADL's / Homemaking Assistance Needed: occasionally requires assist for bathing lower body secondary to pain        Hand Dominance        Extremity/Trunk Assessment   Upper Extremity Assessment Upper Extremity Assessment: Defer to OT evaluation    Lower Extremity Assessment Lower Extremity Assessment: RLE deficits/detail;LLE deficits/detail RLE Deficits / Details: 5/5  LLE Deficits / Details: 5/5; pain with left knee extension    Cervical / Trunk Assessment Cervical / Trunk Assessment: Other exceptions Cervical / Trunk Exceptions: s/p spinal sx  Communication   Communication: No difficulties  Cognition Arousal/Alertness: Awake/alert Behavior During Therapy: WFL for tasks assessed/performed Overall Cognitive Status: Within Functional Limits for tasks assessed  General Comments      Exercises     Assessment/Plan    PT Assessment Patent does not need any further PT services  PT Problem List         PT Treatment Interventions      PT Goals (Current goals can be found in  the Care Plan section)  Acute Rehab PT Goals Patient Stated Goal: be able to walk more PT Goal Formulation: All assessment and education complete, DC therapy    Frequency     Barriers to discharge        Co-evaluation               AM-PAC PT "6 Clicks" Daily Activity  Outcome Measure Difficulty turning over in bed (including adjusting bedclothes, sheets and blankets)?: None Difficulty moving from lying on back to sitting on the side of the bed? : A Little Difficulty sitting down on and standing up from a chair with arms (e.g., wheelchair, bedside commode, etc,.)?: A Little Help needed moving to and from a bed to chair (including a wheelchair)?: A Little Help needed walking in hospital room?: A Little Help needed climbing 3-5 steps with a railing? : A Little 6 Click Score: 19    End of Session Equipment Utilized During Treatment: Back brace;Gait belt Activity Tolerance: Patient tolerated treatment well Patient left: in bed;with call bell/phone within reach;with family/visitor present Nurse Communication: Mobility status PT Visit Diagnosis: Difficulty in walking, not elsewhere classified (R26.2);Pain Pain - part of body: (back)    Time: [id number] PT Time Calculation (min) (ACUTE ONLY): 18 min   Charges:   PT Evaluation $PT Eval Low Complexity: 1 Low         Laurina Bustlearoline Vanda Waskey, PT, DPT Acute Rehabilitation Services Pager [phone] Office [phone]   Vanetta MuldersCarloine H Jaclene Bartelt 06/18/2018, 9:18 AM

## 2018-06-18 NOTE — Progress Notes (Signed)
Patient alert and oriented, mae's well, voiding adequate amount of urine, swallowing without difficulty,  c/o  Mild pain at time of discharge. Patient discharged home with family. Script and discharged instructions given to patient. Patient and family stated understanding of instructions given. Patient has an appointment with Dr. Yevette Edwardsumonski

## 2018-06-18 NOTE — Progress Notes (Signed)
Occupational Therapy Evaluation Patient Details Name: Alexander Knight MRN: [id number] DOB: [birth year] Today's Date: 06/18/2018    History of Present Illness Pt is s/p L4-S1 fusion   Clinical Impression   This 27 y/o male presents with the above. Prior to this admission pt was utilizing [facility] for functional mobility, receiving some intermittent assist for ADLs due to pain. Pt demonstrating functional mobility without AD and supervision-minguard assist this session. He currently requires setup-minguard assist for seated UB ADLs, modA for LB ADLs secondary to adhering to back precautions. Pt will return home with significant other who pt reports will be able to assist with ADLs PRN. Educated pt regarding back precautions, safety, AE and compensatory strategies for completing ADLs while maintaining precautions with pt verbalizing understanding. Questions answered throughout. Pt reports feeling comfortable completing ADLs and functional transfers after return home given available family assist. No further acute OT needs identified at this time. Will sign off.     Follow Up Recommendations  No OT follow up;Supervision/Assistance - 24 hour(24hr initially)    Equipment Recommendations  None recommended by OT(pt's DME needs are met)           Precautions / Restrictions Precautions Precautions: Fall;Back Precaution Booklet Issued: Yes (comment) Precaution Comments: verbally reviewed with pt/pt's significant other Required Braces or Orthoses: Spinal Brace Spinal Brace: Thoracolumbosacral orthotic;Applied in sitting position Restrictions Weight Bearing Restrictions: No      Mobility Bed Mobility Overal bed mobility: Modified Independent             General bed mobility comments: pt demonstrating log roll technique to return to supine   Transfers Overall transfer level: Needs assistance Equipment used: None Transfers: Sit to/from Stand Sit to Stand: Supervision         General  transfer comment: for general safety    Balance Overall balance assessment: Mild deficits observed, not formally tested                                         ADL either performed or assessed with clinical judgement   ADL Overall ADL's : Needs assistance/impaired Eating/Feeding: Independent;Sitting   Grooming: Min guard;Standing   Upper Body Bathing: Min guard;Sitting   Lower Body Bathing: Minimal assistance;Sit to/from stand   Upper Body Dressing : Min guard;Sitting   Lower Body Dressing: Moderate assistance;Sit to/from stand Lower Body Dressing Details (indicate cue type and reason): minguard assist for standing balance, increased assist due to adhering to back precuations; pt reports significant other can assist with these needs Toilet Transfer: Min guard;Ambulation;BSC Toilet Transfer Details (indicate cue type and reason): over toilet; pt exiting bathroom upon entering room Toileting- Clothing Manipulation and Hygiene: Min guard;Sit to/from stand Toileting - Clothing Manipulation Details (indicate cue type and reason): educated on safety with peri-care while maintaning precautions     Functional mobility during ADLs: Min guard General ADL Comments: 27 y.o. male with history h/o 27 y.o. male with medical history significant of intellectual disability; schizophrenia; HTN; DM; and ESRD on MWF HD presenting increasing agitation at assisted living facility.  Patient missed scheduled dialysis on 9/16, due to agitation and not cooperating with medication intake.  Pt with hx of previous admissions for similar complaints.                          Pertinent Vitals/Pain Pain Assessment: Faces Faces Pain Scale: Hurts  little more Pain Location: incisional, back Pain Descriptors / Indicators: Operative site guarding;Grimacing Pain Intervention(s): Monitored during session     Hand Dominance     Extremity/Trunk Assessment Upper Extremity Assessment Upper  Extremity Assessment: Overall WFL for tasks assessed   Lower Extremity Assessment Lower Extremity Assessment: Defer to PT evaluation RLE Deficits / Details: 5/5  LLE Deficits / Details: 5/5; pain with left knee extension   Cervical / Trunk Assessment Cervical / Trunk Assessment: Other exceptions Cervical / Trunk Exceptions: s/p spinal sx   Communication Communication Communication: No difficulties   Cognition Arousal/Alertness: Awake/alert Behavior During Therapy: WFL for tasks assessed/performed Overall Cognitive Status: Within Functional Limits for tasks assessed                                                      Home Living Family/patient expects to be discharged to:: Private residence Living Arrangements: Spouse/significant other Available Help at Discharge: Family Type of Home: House Home Access: Level entry     Home Layout: One level     Bathroom Shower/Tub: Walk-in Psychologist, prison and probation services: Handicapped height     Home Equipment: Cane - single point;Toilet riser;Shower seat;Grab bars - tub/shower          Prior Functioning/Environment Level of Independence: Needs assistance  Gait / Transfers Assistance Needed: using cane for mobility ADL's / Homemaking Assistance Needed: occasionally requires assist for bathing lower body secondary to pain            OT Problem List: Decreased strength;Decreased activity tolerance;Decreased knowledge of precautions;Pain      OT Treatment/Interventions:      OT Goals(Current goals can be found in the care plan section) Acute Rehab OT Goals Patient Stated Goal: be able to walk more; less pain with mobility OT Goal Formulation: All assessment and education complete, DC therapy  OT Frequency:     Barriers to D/C:            Co-evaluation              AM-PAC PT "6 Clicks" Daily Activity     Outcome Measure Help from another person eating meals?: None Help from another person taking  care of personal grooming?: None Help from another person toileting, which includes using toliet, bedpan, or urinal?: None Help from another person bathing (including washing, rinsing, drying)?: A Little Help from another person to put on and taking off regular upper body clothing?: A Little Help from another person to put on and taking off regular lower body clothing?: A Little 6 Click Score: 21   End of Session Nurse Communication: Mobility status  Activity Tolerance: Patient tolerated treatment well Patient left: in bed;with family/visitor present;with call bell/phone within reach  OT Visit Diagnosis: Other abnormalities of gait and mobility (R26.89);Pain Pain - part of body: (back)                Time: 0100-7121 OT Time Calculation (min): 10 min Charges:  OT General Charges $OT Visit: 1 Visit OT Evaluation $OT Eval Low Complexity: 1 Low  Lou Cal, OT Supplemental Rehabilitation Services Pager [phone] Office [phone]    Raymondo Band 06/18/2018, 10:36 AM

## 2018-06-18 NOTE — Progress Notes (Signed)
    Patient doing well  Has been ambulating Denies leg pain   Physical Exam: Vitals:   06/17/18 2312 06/18/18 0348  BP: 113/72 109/61  Pulse: 90 85  Resp: 20 20  Temp: 98.6 F (37 C) 98 F (36.7 C)  SpO2: 98% 100%    Dressing in place NVI  POD #1 s/p s/p L4-S1 fusion, doing well  - up with PT/OT, encourage ambulation - Percocet for pain, Valium for muscle spasms - likely d/c home today with f/u in 2 weeks

## 2018-06-23 MED FILL — Heparin Sodium (Porcine) Inj 1000 Unit/ML: INTRAMUSCULAR | Qty: 30 | Status: AC

## 2018-06-23 MED FILL — Sodium Chloride IV Soln 0.9%: INTRAVENOUS | Qty: 1000 | Status: AC

## 2018-07-01 NOTE — Discharge Summary (Signed)
Patient ID: Alexander Knight MRN: [id number] DOB/AGE: 1991/02/27 27 y.o.  Admit date: 06/17/2018 Discharge date: 06/18/2018  Admission Diagnoses:  Active Problems:   DDD (degenerative disc disease), lumbosacral   Discharge Diagnoses:  Same  Past Medical History:  Diagnosis Date  . DDD (degenerative disc disease), lumbar   . GERD (gastroesophageal reflux disease)    tums prn  . Spine instability   . Tremor    attention tremor    Surgeries: Procedure(s): RIGHT-SIDED LUMBAR 4-5, LUMBAR 5 - SACRUM 1 TRANSFORAMINAL LUMBAR INTERBODY FUSION WITH INSTRUMENTATION AND ALLOGRAFT (6 hours requested) on 06/17/2018   Consultants: None  Discharged Condition: Improved  Hospital Course: Alexander Knight is an 27 y.o. male who was admitted 06/17/2018 for operative treatment of radiculopathy. Patient has severe unremitting pain that affects sleep, daily activities, and work/hobbies. After pre-op clearance the patient was taken to the operating room on 06/17/2018 and underwent  Procedure(s): RIGHT-SIDED LUMBAR 4-5, LUMBAR 5 - SACRUM 1 TRANSFORAMINAL LUMBAR INTERBODY FUSION WITH INSTRUMENTATION AND ALLOGRAFT (6 hours requested).    Patient was given perioperative antibiotics:  Anti-infectives (From admission, onward)   Start     Dose/Rate Route Frequency Ordered Stop   06/17/18 1800  ceFAZolin (ANCEF) IVPB 1 g/50 mL premix     1 g 100 mL/hr over 30 Minutes Intravenous Every 8 hours 06/17/18 1712 06/18/18 0226   06/17/18 0630  ceFAZolin (ANCEF) IVPB 2g/100 mL premix     2 g 200 mL/hr over 30 Minutes Intravenous On call to O.R. 06/17/18 0616 06/17/18 1302   06/17/18 0621  ceFAZolin (ANCEF) 2-4 GM/100ML-% IVPB    Note to Pharmacy:  Edwina Barth   : cabinet override      06/17/18 0621 06/17/18 0857       Patient was given sequential compression devices, early ambulation to prevent DVT.  Patient benefited maximally from hospital stay and there were no complications.    Recent vital signs: BP  125/74 (BP Location: Left Arm)   Pulse 88   Temp 98.1 F (36.7 C) (Oral)   Resp 19   Ht 5\' 11"  (1.803 m)   Wt 100.7 kg   SpO2 99%   BMI 30.96 kg/m    Discharge Medications:   Allergies as of 06/18/2018      Reactions   Codeine Nausea And Vomiting      Medication List    You have not been prescribed any medications.     Diagnostic Studies: Dg Lumbar Spine 2-3 Views  Result Date: 06/17/2018 CLINICAL DATA:  Lumbar fusion EXAM: DG C-ARM 61-120 MIN; LUMBAR SPINE - 2-3 VIEW COMPARISON:  01/15/2018 FLUOROSCOPY TIME:  Radiation Exposure Index (as provided by the fluoroscopic device): 56.81 mGy If the device does not provide the exposure index: Fluoroscopy Time:  138 seconds Number of Acquired Images:  2 FINDINGS: Pedicle screws are noted at L4, L5 and S1 with posterior fixation. Interbody fusion at L4-5 and L5-S1 is noted. IMPRESSION: Spinal fusion from L4-S1. Electronically Signed   By: Alcide Clever M.D.   On: 06/17/2018 16:10   Dg Lumbar Spine 1 View  Result Date: 06/17/2018 CLINICAL DATA:  Elective surgery of lumbar spine. EXAM: LUMBAR SPINE - 1 VIEW COMPARISON:  CT scan of January 15, 2018. FINDINGS: Single intraoperative cross-table lateral projection of the lumbar spine demonstrates 1 surgical probe directed toward posterior spinous process of L4. Another probe is seen directed toward S1 level. IMPRESSION: Surgical localization as described above. Electronically Signed   By: Roque Lias  Montez Hageman, M.D.   On: 06/17/2018 13:36   Dg C-arm 1-60 Min  Result Date: 06/17/2018 CLINICAL DATA:  Lumbar fusion EXAM: DG C-ARM 61-120 MIN; LUMBAR SPINE - 2-3 VIEW COMPARISON:  01/15/2018 FLUOROSCOPY TIME:  Radiation Exposure Index (as provided by the fluoroscopic device): 56.81 mGy If the device does not provide the exposure index: Fluoroscopy Time:  138 seconds Number of Acquired Images:  2 FINDINGS: Pedicle screws are noted at L4, L5 and S1 with posterior fixation. Interbody fusion at L4-5 and L5-S1 is  noted. IMPRESSION: Spinal fusion from L4-S1. Electronically Signed   By: Alcide Clever M.D.   On: 06/17/2018 16:10   Dg C-arm 1-60 Min  Result Date: 06/17/2018 CLINICAL DATA:  Lumbar fusion EXAM: DG C-ARM 61-120 MIN; LUMBAR SPINE - 2-3 VIEW COMPARISON:  01/15/2018 FLUOROSCOPY TIME:  Radiation Exposure Index (as provided by the fluoroscopic device): 56.81 mGy If the device does not provide the exposure index: Fluoroscopy Time:  138 seconds Number of Acquired Images:  2 FINDINGS: Pedicle screws are noted at L4, L5 and S1 with posterior fixation. Interbody fusion at L4-5 and L5-S1 is noted. IMPRESSION: Spinal fusion from L4-S1. Electronically Signed   By: Alcide Clever M.D.   On: 06/17/2018 16:10   Dg C-arm 1-60 Min  Result Date: 06/17/2018 CLINICAL DATA:  Lumbar fusion EXAM: DG C-ARM 61-120 MIN; LUMBAR SPINE - 2-3 VIEW COMPARISON:  01/15/2018 FLUOROSCOPY TIME:  Radiation Exposure Index (as provided by the fluoroscopic device): 56.81 mGy If the device does not provide the exposure index: Fluoroscopy Time:  138 seconds Number of Acquired Images:  2 FINDINGS: Pedicle screws are noted at L4, L5 and S1 with posterior fixation. Interbody fusion at L4-5 and L5-S1 is noted. IMPRESSION: Spinal fusion from L4-S1. Electronically Signed   By: Alcide Clever M.D.   On: 06/17/2018 16:10    Disposition:    POD #1 s/p s/p L4-S1 fusion, doing well  - up with PT/OT, encourage ambulation - Percocet for pain, Valium for muscle spasms -Written scripts for pain signed and in chart -D/C instructions sheet printed and in chart -D/C today  -F/U in office 2 weeks   Signed: Georga Bora 07/01/2018, 2:06 PM

## 2019-03-29 IMAGING — XA DG DISKOGRAPHY LUMBAR S+I
13 of 14 series · 13 of 14 positions shown · non-contrast
Comparison: none

CLINICAL DATA: Recurrent low back pain extending into the left
lower extremity. The patient reports complete relieved of similar
symptoms following left laminectomies at L4 and L5-1 year ago.
Symptoms have recurred with increased severity. He also has some
tingling into the toes on the right. MRI demonstrates degenerative
disc changes at L4-5 and L5-S1.

[Series 1: ortho standard · 1 of 1 slices shown (1 of 13)]
[im 1/1]
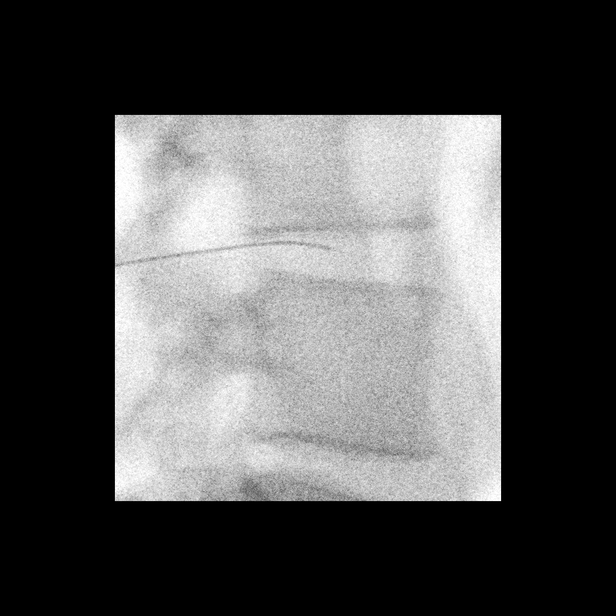

[Series 2: ortho standard · 1 of 1 slices shown (2 of 13)]
[im 1/1]
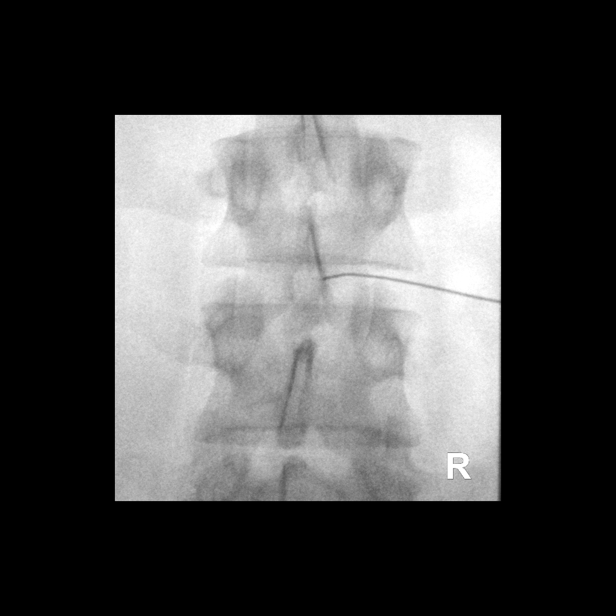

[Series 3: ortho standard · 1 of 1 slices shown (3 of 13)]
[im 1/1]
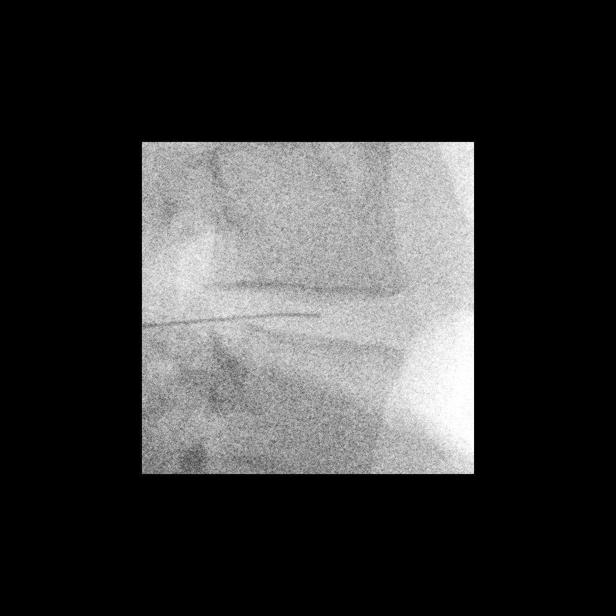

[Series 4: ortho standard · 1 of 1 slices shown (4 of 13)]
[im 1/1]
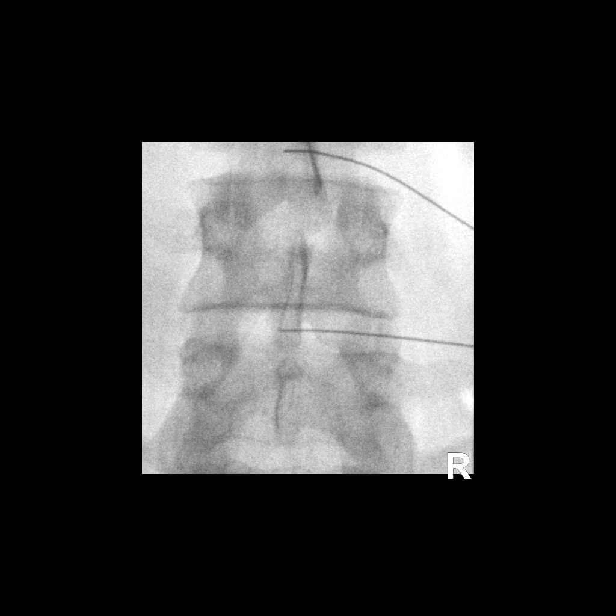

[Series 5: ortho standard · 1 of 1 slices shown (5 of 13)]
[im 1/1]
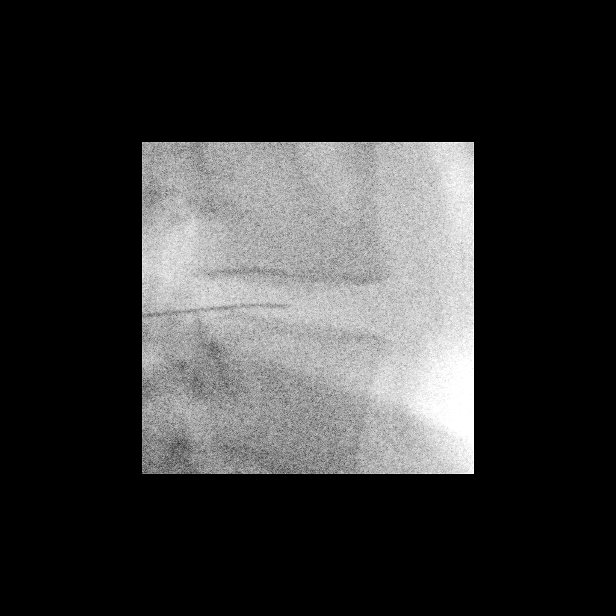

[Series 6: ortho standard · 1 of 1 slices shown (6 of 13)]
[im 1/1]
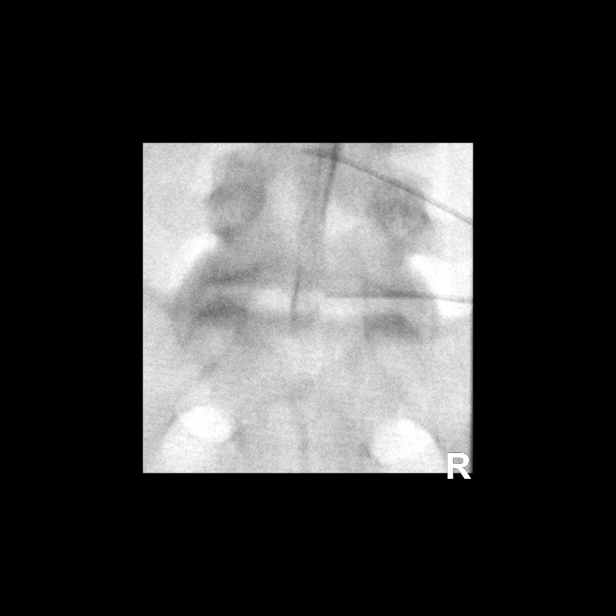

[Series 8: ortho standard · 1 of 1 slices shown (7 of 13)]
[im 1/1]
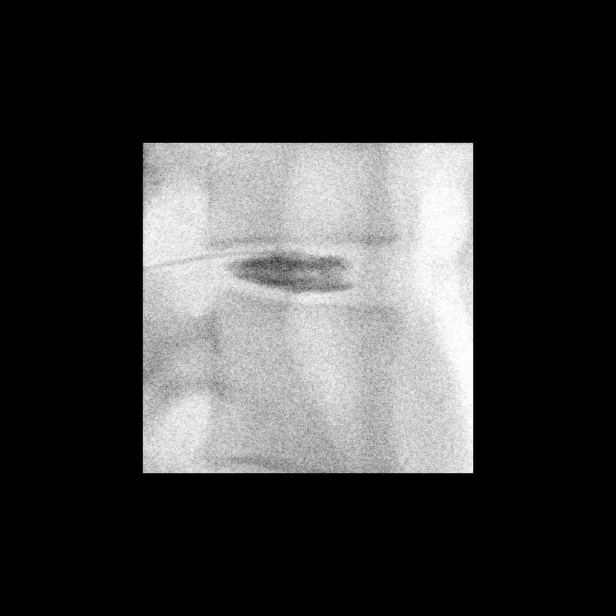

[Series 9: ortho standard · 1 of 1 slices shown (8 of 13)]
[im 1/1]
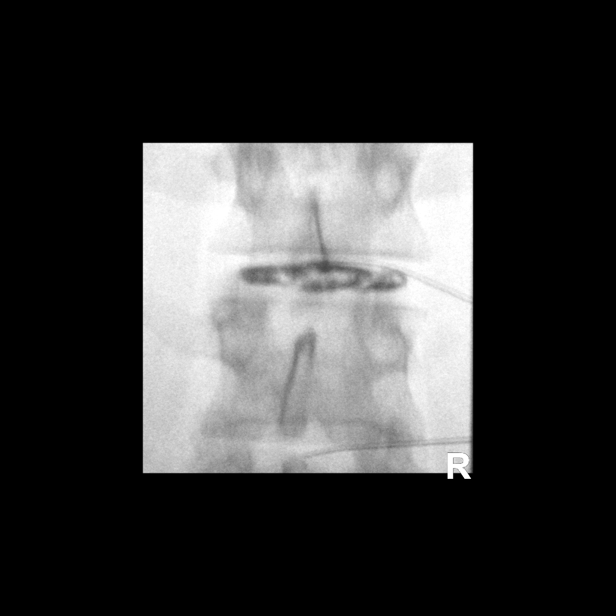

[Series 10: ortho standard · 1 of 1 slices shown (9 of 13)]
[im 1/1]
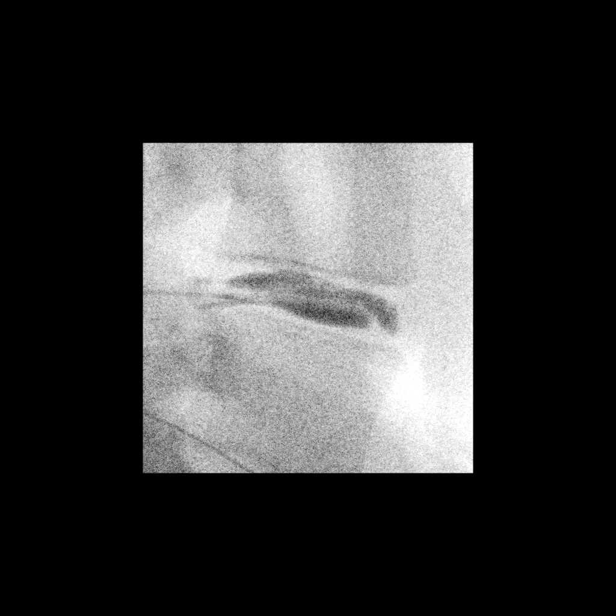

[Series 11: ortho standard · 1 of 1 slices shown (10 of 13)]
[im 1/1]
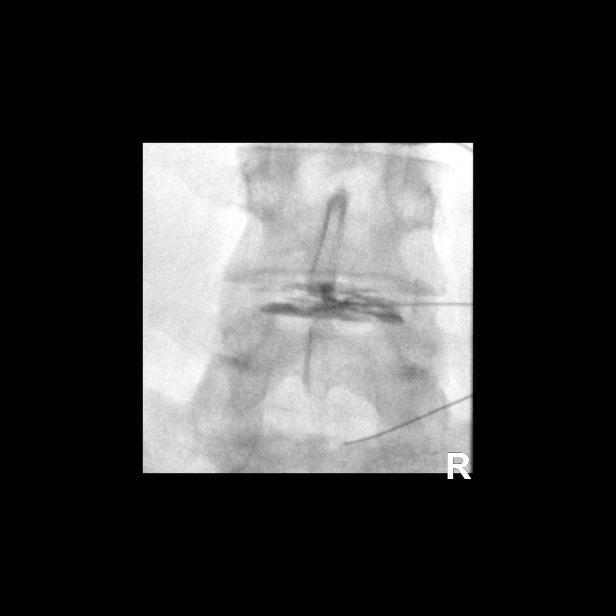

[Series 12: ortho standard · 1 of 1 slices shown (11 of 13)]
[im 1/1]
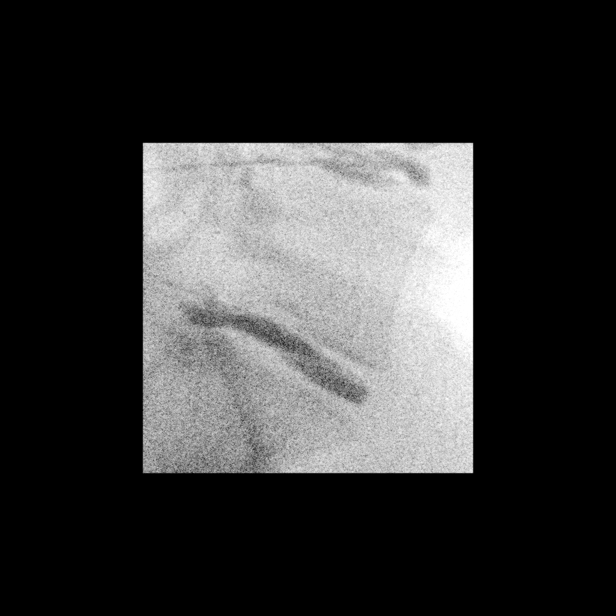

[Series 13: ortho standard · 1 of 1 slices shown (12 of 13)]
[im 1/1]
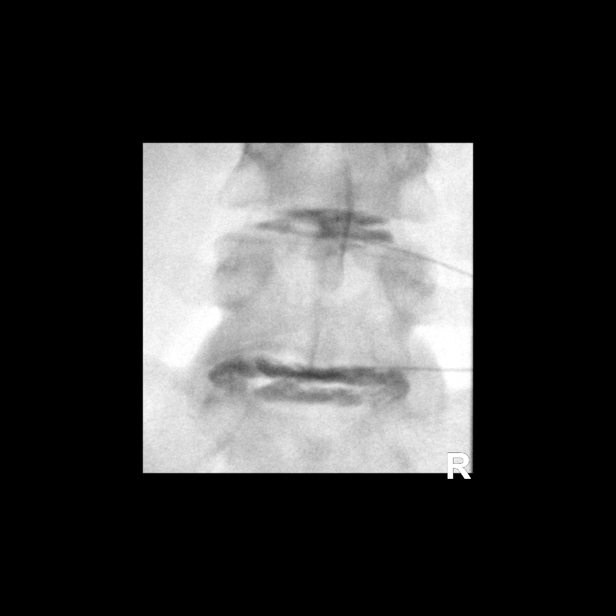

[Series 14: ortho standard · 1 of 1 slices shown (13 of 13)]
[im 1/1]
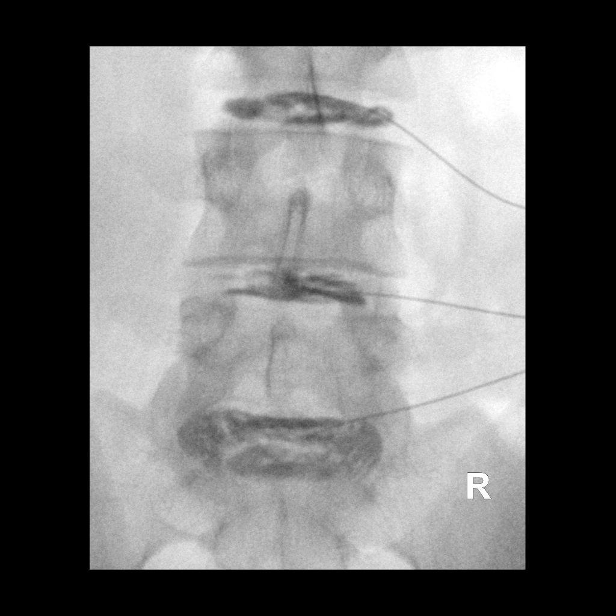

[13 of 14 positions shown; findings below may reference images not displayed]

EXAM:
DIAGNOSTIC LUMBAR DISK INJECTIONS

FLUOROSCOPY TIME:  Radiation Exposure Index (as provided by the
fluoroscopic device): 108.14 uGy*m2

Fluoroscopy Time:  1 minutes 50 seconds

Number of Acquired Images:  14

PROCEDURE:
The procedure was discussed in depth with the patient including the
potential risk of infection. The patient received 1 gram of Ancef
intravenously prior to the procedure with a small amount withdrawn
and added to the contrast for injection.

The patient was placed prone on the fluoroscopic table. A Betadine
scrub of the low back was performed and the patient was draped in a
sterile fashion.

Skin anesthesia was carried [DATE]% Lidocaine. 15 cm 22 gauge
Rimas Ir Ovcinikov were directed into the nuclear regions of the disks at
L3-4, L4-5, and L5-S1. Isovue-M 200 mixed with Ancef was injected
using a pressure monitoring syringe. Spot radiographs were taken.
The patient's symptoms were recorded. Following the procedure, the
patient was treated with intravenous Toradol and Fentanyl. The
patient was taken to CT scan in good condition. The patient was
observed for an hour after the CT scan and discharged in good
condition.
FINDINGS: The patient reported no significant pain lying on the exam table in
the prone position.

L3-4: The opening pressure was 40 PSI. The patient experienced pain
at 100 PSI. The pain was discordant. 2.5 ml of contrast was used.
The patient rated her pain as [DATE]. He described the pain as mostly
pressure with some tingling sensation and numbness in the left
thigh. Imaging demonstrates contrast contained within the central
nucleus of the disc.

L4-5: The opening pressure was 20 PSI. The patient experienced pain
at 40 PSI. The pain was concordant. 2.0 ml of contrast was used. The
patient rated her pain as 6 or [DATE]. He described the pain as low
back pain extending into the left thigh, typical of his usual pain.
Imaging demonstrates a posterior annular tear with contrast
extending into the annular space.

L5-S1: The opening pressure was 10 PSI. The patient experienced pain
at 40 PSI. The pain was concordant. 3.0 ml of contrast was used. The
patient rated her pain as [DATE]. He described the pain as low back
pain extending into the left lower extremity. Additionally, the
patient experienced muscle spasms. Imaging demonstrates diffuse
annular contrast with extension into the ventral epidural space.

CT imaging was performed from L1 through S1-2.

L3-4: Contrast contained within the central nucleus of the disc. No
significant stenosis is present.

L4-5: A left paramedian disc protrusion is associated with an
annular tear. Contrast extends from the nucleus posteriorly into the
ventral epidural protrusion on the left. Left subarticular and
foraminal stenosis is present despite left laminectomy.

L5-S1: Contrast disperses diffusely throughout the disc compatible
with a diffusely degenerated disc. There is contrast in the ventral
epidural space bilaterally. Mild foraminal narrowing is present
bilaterally. Left laminectomy is noted.
IMPRESSION: 1. Concordant pain associated with low pressure injections at L4-5
and L5-S1 as described above.
2. Normal control disc at L3-4 without significant pain at fairly
high pressures.
3. Posterior central annular tear and left paramedian disc
protrusion at L4-5 results and left subarticular and foraminal
stenosis despite laminectomy.
4. Diffuse disc degeneration with contrast throughout the annular
fibers at L5-S1 and extending into the ventral epidural space
bilaterally.
5. Mild foraminal narrowing bilaterally at L5-S1.

## 2019-03-29 IMAGING — CT CT L SPINE W/O CM
1 of 4 series · 6 of 14 positions shown, 8 images · non-contrast
Comparison: none

CLINICAL DATA: Recurrent low back pain extending into the left
lower extremity. The patient reports complete relieved of similar
symptoms following left laminectomies at L4 and L5-1 year ago.
Symptoms have recurred with increased severity. He also has some
tingling into the toes on the right. MRI demonstrates degenerative
disc changes at L4-5 and L5-S1.

[Series 2: l spine soft · axial · 0.26mm/px · z∈[-140,-26]mm · 6 of 54 slices shown, 8 images]
[im 8/54  soft-tissue]
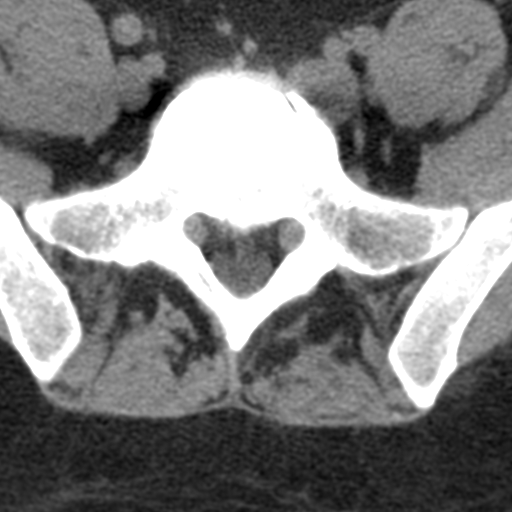
[im 8/54  bone]
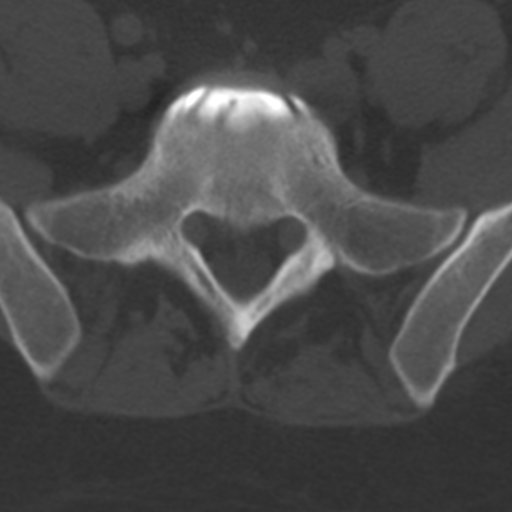
[im 16/54  bone]
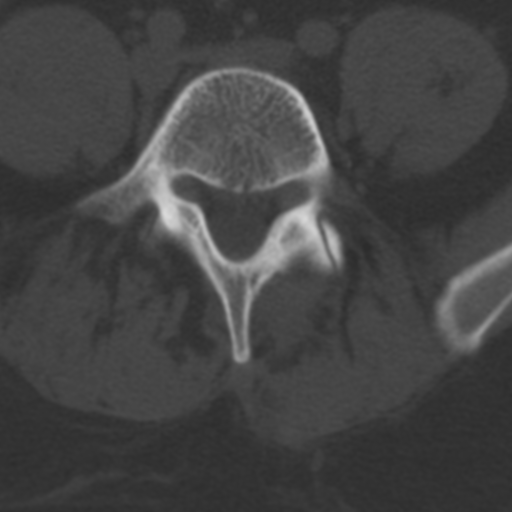
[im 23/54  bone]
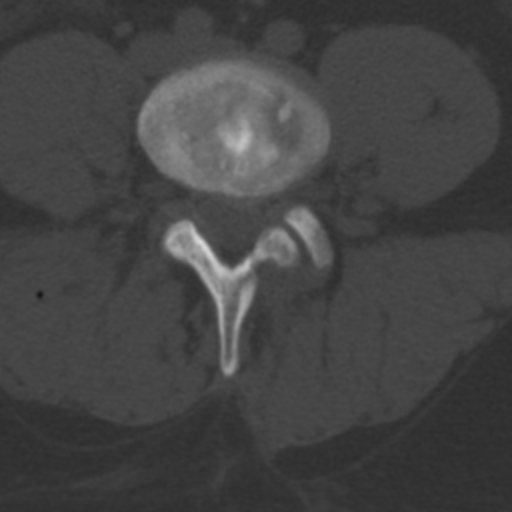
[im 31/54  bone]
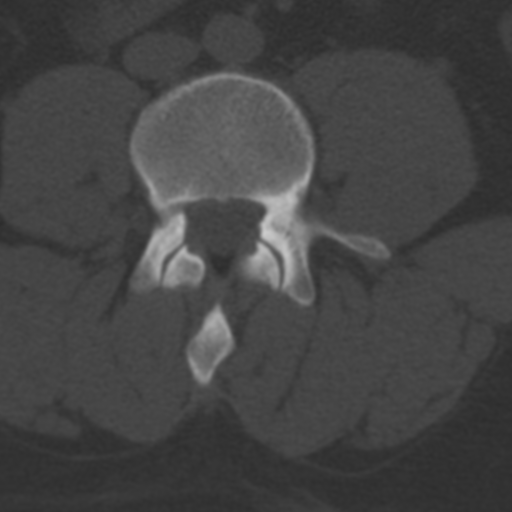
[im 38/54  soft-tissue]
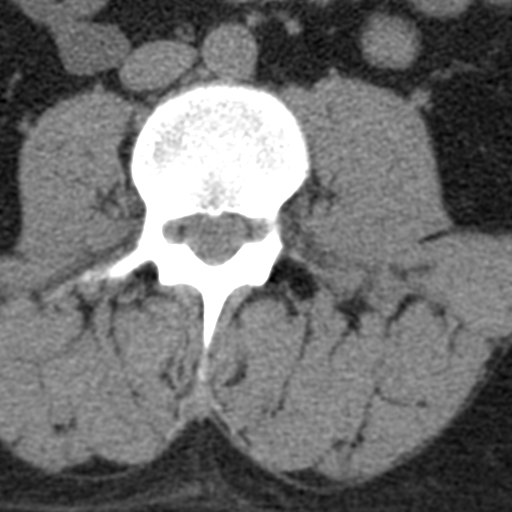
[im 38/54  bone]
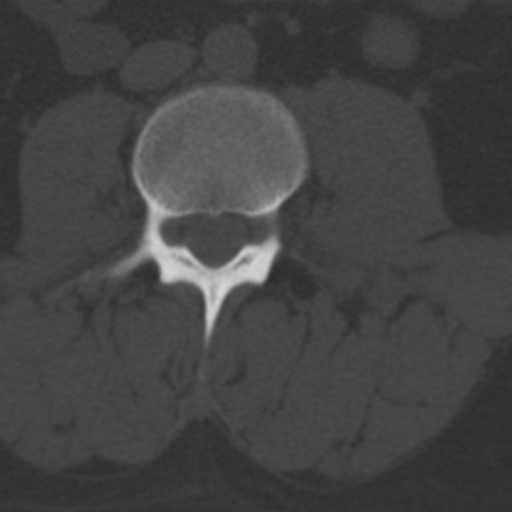
[im 46/54  bone]
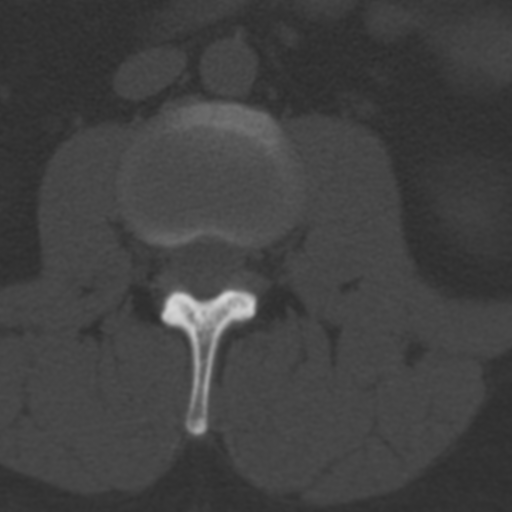

[6 of 14 positions shown; findings below may reference images not displayed]

EXAM:
DIAGNOSTIC LUMBAR DISK INJECTIONS

FLUOROSCOPY TIME:  Radiation Exposure Index (as provided by the
fluoroscopic device): 108.14 uGy*m2

Fluoroscopy Time:  1 minutes 50 seconds

Number of Acquired Images:  14

PROCEDURE:
The procedure was discussed in depth with the patient including the
potential risk of infection. The patient received 1 gram of Ancef
intravenously prior to the procedure with a small amount withdrawn
and added to the contrast for injection.

The patient was placed prone on the fluoroscopic table. A Betadine
scrub of the low back was performed and the patient was draped in a
sterile fashion.

Skin anesthesia was carried [DATE]% Lidocaine. 15 cm 22 gauge
Rimas Ir Ovcinikov were directed into the nuclear regions of the disks at
L3-4, L4-5, and L5-S1. Isovue-M 200 mixed with Ancef was injected
using a pressure monitoring syringe. Spot radiographs were taken.
The patient's symptoms were recorded. Following the procedure, the
patient was treated with intravenous Toradol and Fentanyl. The
patient was taken to CT scan in good condition. The patient was
observed for an hour after the CT scan and discharged in good
condition.
FINDINGS: The patient reported no significant pain lying on the exam table in
the prone position.

L3-4: The opening pressure was 40 PSI. The patient experienced pain
at 100 PSI. The pain was discordant. 2.5 ml of contrast was used.
The patient rated her pain as [DATE]. He described the pain as mostly
pressure with some tingling sensation and numbness in the left
thigh. Imaging demonstrates contrast contained within the central
nucleus of the disc.

L4-5: The opening pressure was 20 PSI. The patient experienced pain
at 40 PSI. The pain was concordant. 2.0 ml of contrast was used. The
patient rated her pain as 6 or [DATE]. He described the pain as low
back pain extending into the left thigh, typical of his usual pain.
Imaging demonstrates a posterior annular tear with contrast
extending into the annular space.

L5-S1: The opening pressure was 10 PSI. The patient experienced pain
at 40 PSI. The pain was concordant. 3.0 ml of contrast was used. The
patient rated her pain as [DATE]. He described the pain as low back
pain extending into the left lower extremity. Additionally, the
patient experienced muscle spasms. Imaging demonstrates diffuse
annular contrast with extension into the ventral epidural space.

CT imaging was performed from L1 through S1-2.

L3-4: Contrast contained within the central nucleus of the disc. No
significant stenosis is present.

L4-5: A left paramedian disc protrusion is associated with an
annular tear. Contrast extends from the nucleus posteriorly into the
ventral epidural protrusion on the left. Left subarticular and
foraminal stenosis is present despite left laminectomy.

L5-S1: Contrast disperses diffusely throughout the disc compatible
with a diffusely degenerated disc. There is contrast in the ventral
epidural space bilaterally. Mild foraminal narrowing is present
bilaterally. Left laminectomy is noted.
IMPRESSION: 1. Concordant pain associated with low pressure injections at L4-5
and L5-S1 as described above.
2. Normal control disc at L3-4 without significant pain at fairly
high pressures.
3. Posterior central annular tear and left paramedian disc
protrusion at L4-5 results and left subarticular and foraminal
stenosis despite laminectomy.
4. Diffuse disc degeneration with contrast throughout the annular
fibers at L5-S1 and extending into the ventral epidural space
bilaterally.
5. Mild foraminal narrowing bilaterally at L5-S1.

## 2019-08-29 IMAGING — RF DG LUMBAR SPINE 2-3V
1 series · 2 of 2 positions shown · non-contrast
Comparison: 01/15/2018

CLINICAL DATA: Lumbar fusion

EXAM:
DG C-ARM 61-120 MIN; LUMBAR SPINE - 2-3 VIEW

[Series 1: run · 2 of 2 slices shown]
[im 1/2]
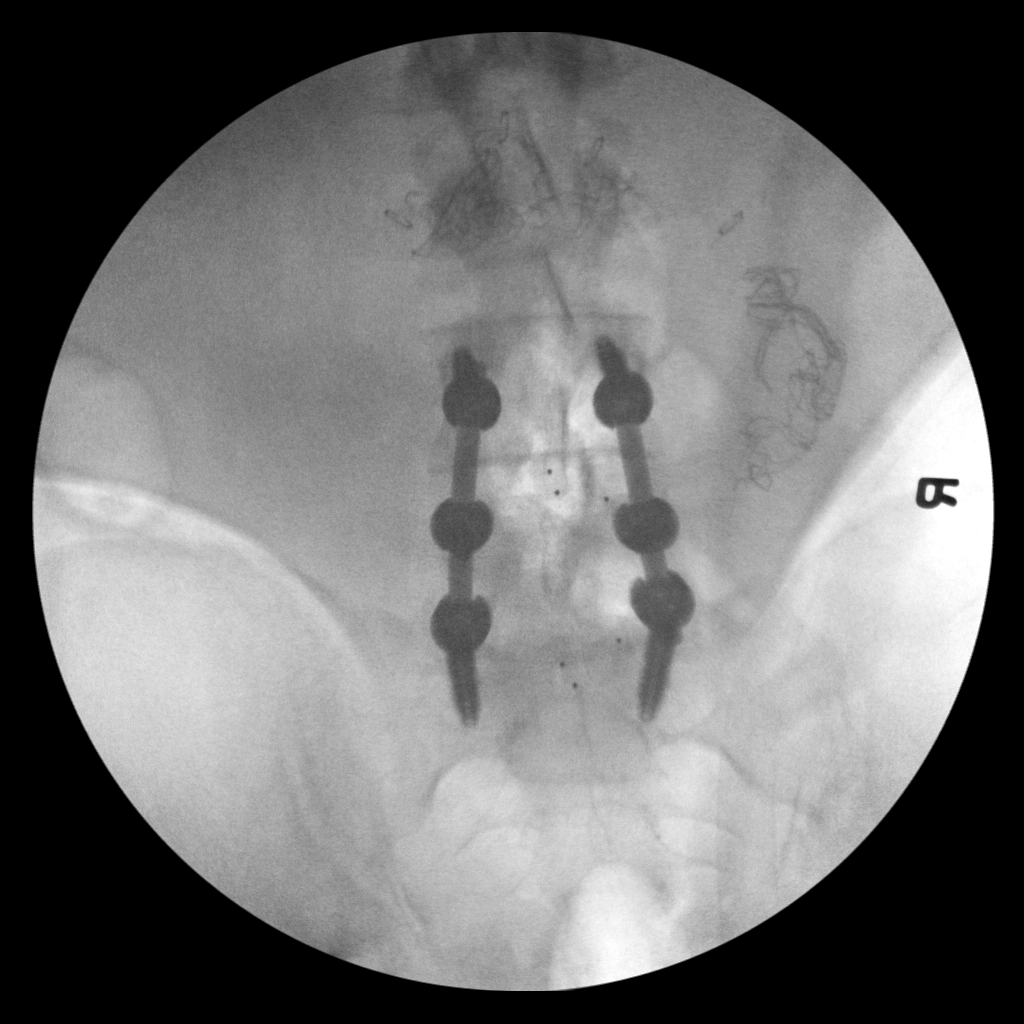
[im 2/2]
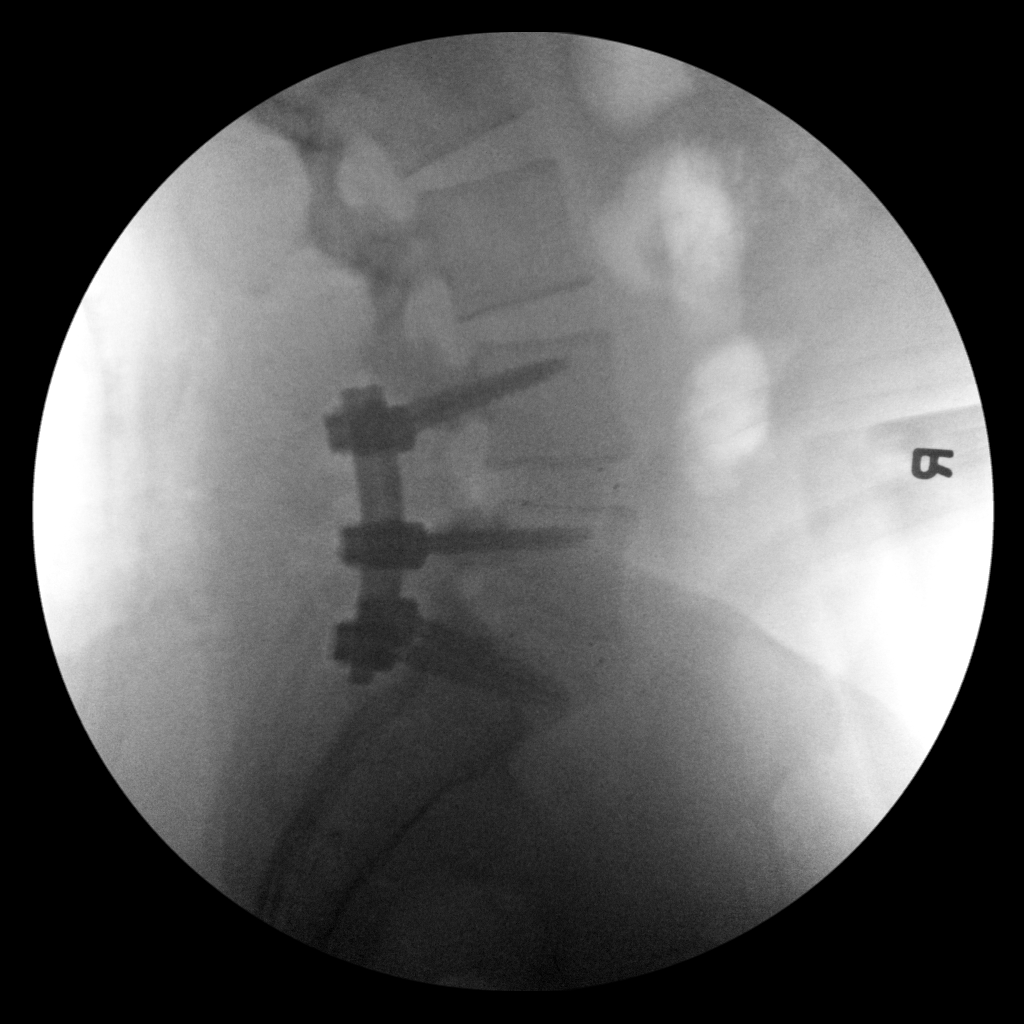

[2 of 2 positions shown; findings below may reference images not displayed]

FLUOROSCOPY TIME:  Radiation Exposure Index (as provided by the
fluoroscopic device): 56.81 mGy

If the device does not provide the exposure index:

Fluoroscopy Time:  138 seconds

Number of Acquired Images:  2
FINDINGS: Pedicle screws are noted at L4, L5 and S1 with posterior fixation.
Interbody fusion at L4-5 and L5-S1 is noted.
IMPRESSION: Spinal fusion from L4-S1.
